# Patient Record
Sex: Female | Born: 1990 | Race: Black or African American | Hispanic: No | Marital: Single | Smoking: Never smoker
Health system: Southern US, Community
[De-identification: ages and names within clinical notes are randomized; demographics above are authoritative.]

## PROBLEM LIST (undated history)

## (undated) DIAGNOSIS — E669 Obesity, unspecified: Secondary | ICD-10-CM

## (undated) DIAGNOSIS — E119 Type 2 diabetes mellitus without complications: Secondary | ICD-10-CM

## (undated) DIAGNOSIS — I10 Essential (primary) hypertension: Secondary | ICD-10-CM

## (undated) HISTORY — DX: Obesity, unspecified: E66.9

---
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
## 2011-01-30 ENCOUNTER — Other Ambulatory Visit: Payer: Self-pay | Admitting: Family

## 2011-01-30 ENCOUNTER — Ambulatory Visit (INDEPENDENT_AMBULATORY_CARE_PROVIDER_SITE_OTHER): Payer: 59 | Admitting: Family

## 2011-01-30 ENCOUNTER — Encounter: Payer: Self-pay | Admitting: Family

## 2011-01-30 VITALS — BP 90/60 | HR 88 | Temp 97.2°F | Resp 16 | Ht 65.0 in | Wt 268.0 lb

## 2011-01-30 DIAGNOSIS — N898 Other specified noninflammatory disorders of vagina: Secondary | ICD-10-CM

## 2011-01-30 DIAGNOSIS — N760 Acute vaginitis: Secondary | ICD-10-CM

## 2011-01-30 DIAGNOSIS — A499 Bacterial infection, unspecified: Secondary | ICD-10-CM

## 2011-01-30 DIAGNOSIS — B9689 Other specified bacterial agents as the cause of diseases classified elsewhere: Secondary | ICD-10-CM

## 2011-01-30 MED ORDER — METRONIDAZOLE 500 MG PO TABS: 500.0000 mg | ORAL_TABLET | Freq: Two times a day (BID) | ORAL | Status: AC

## 2011-01-30 NOTE — Patient Instructions (Signed)
Bacterial Vaginosis (BV)  Bacterial vaginosis (BV) is a vaginal infection where the normal balance of bacteria in the vagina is disrupted. The normal balance is then replaced by an overgrowth of certain bacteria. There are several different kinds of bacteria that can cause BV. BV is the most common vaginal infection in women of childbearing age.  CAUSES   The cause of BV is not fully understood. BV develops when there is an increase or imbalance of harmful bacteria.    Some activities or behaviors can upset the normal balance of bacteria in the vagina and put women at increased risk including:    Having a new sex partner or multiple sex partners.    Douching.    Using an intrauterine device (IUD) for contraception.    It is not clear what role sexual activity plays in the development of BV. However, women that have never had sexual intercourse are rarely infected with BV.   Women do not get BV from toilet seats, bedding, swimming pools or from touching objects around them.   SYMPTOMS   Grey vaginal discharge.    A fish like odor with discharge, especially after sexual intercourse.    Itching or burning of the vagina and vulva.    Burning or pain with urination.    Some women have no signs or symptoms at all.   DIAGNOSIS   Your caregiver must examine the vagina for signs of BV. Your caregiver will perform lab tests and look at the sample of vaginal fluid through a microscope. They will look for bacteria and abnormal cells (clue cells), a pH test higher than 4.5, and a positive amine test all associated with BV.   RISKS AND COMPLICATIONS   Pelvic inflammatory disease (PID).    Infections following gynecology surgery.    Developing HIV.    Developing herpes virus.   TREATMENT  Sometimes BV will clear up without treatment. However, all women with symptoms of BV should be treated to avoid complications, especially if gynecology surgery is planned. Female partners generally do not need to be treated. However,  BV may spread between female sex partners so treatment is helpful in preventing a recurrence of BV.    BV may be treated with medicines that kill germs (antibiotics). The antibiotics come in either pill or vaginal cream forms. Either can be used with non-pregnant or pregnant women, but the recommended dosages differ. These antibiotics are not harmful to the baby.    BV can recur after treatment. If this happens, a second course of antibiotics will often be prescribed.    Treatment is important for pregnant women. If not treated, BV can cause a premature delivery, especially for a pregnant woman who had a premature birth in the past. All pregnant women who have symptoms of BV should be checked and treated.    For chronic reoccurrence of BV, treatment with a type of prescribed gel vaginally twice a week is helpful.   HOME CARE INSTRUCTIONS   Finish all medication as directed by your caregiver.    Do not have sex until treatment is completed.    Tell your sexual partner that you have a vaginal infection. They should see their caregiver and be treated if they have problems, such as a mild rash or itching.    Practice safe sex. Use condoms. Only have one sex partner.   PREVENTION  Basic prevention steps can help reduce the risk of upsetting the natural balance of bacteria in the vagina and   developing BV:   Do not have sexual intercourse (be abstinent).    Do not douche.    Use all of the medicine prescribed for treatment of BV, even if the signs and symptoms go away.    Tell your sex partner if you have BV. That way, they can be treated, if needed, to prevent reoccurrence.   SEEK MEDICAL CARE IF:   Your symptoms are not improving after three days of treatment.    You have increased discharge, pain or fever.   MAKE SURE YOU:    Understand these instructions.    Will watch your condition.    Will get help right away if you are not doing well or get worse.   FOR MORE INFORMATION  Division of STD Prevention  (DSTDP)  Centers for Disease Control and Prevention  www.cdc.gov/std  Order Publications Online at www.cdc.gov/std/pubs/  CDC National Prevention Information Network (NPIN)  E-mail: info@cdcnpin.org  www.cdcnpin.org    American Social Health Association (ASHA)  [address] [address]  [place], [place] [address]  www.ashastd.org   Document Released: 04/13/2005 Document Re-Released: 07/08/2009  Exit[employer] Patient Information 2011 [employer].

## 2011-01-30 NOTE — Progress Notes (Signed)
  Subjective:    Patient ID: [name], female    DOB: 24-Mar-1991, 20 y.o.   MRN: [id number]  HPI Pt is here with report of creamy white discharge x 3 weeks; +odor.  Denies pelvic pain or change in partner; with partner x 5 years.  No other questions or concerns.     Review of Systems  Genitourinary: Positive for vaginal discharge. Negative for vaginal bleeding and vaginal pain.  All other systems reviewed and are negative.       Objective:   Physical Exam  Constitutional: She is oriented to person, place, and time. She appears well-developed and well-nourished.  HENT:  Head: Normocephalic.  Neck: Normal range of motion. Neck supple.  Abdominal: Soft. There is no tenderness.  Genitourinary: Cervix exhibits no motion tenderness, no discharge and no friability. Vaginal discharge (whitish brown creamy discharge) found.  Neurological: She is alert and oriented to person, place, and time.  Skin: Skin is warm and dry.    Wet prep - + clue, no trich, no yeast      Assessment & Plan:  Bacterial Vaginosis  Plan:   Flagyl GC/CT to lab F/U as needed

## 2011-01-31 LAB — GC/CHLAMYDIA PROBE AMP, GENITAL: GC Probe Amp, Genital: NEGATIVE

## 2011-02-02 LAB — WET PREP, GENITAL: Yeast Wet Prep HPF POC: NONE SEEN

## 2011-02-04 ENCOUNTER — Ambulatory Visit: Payer: 59 | Admitting: Obstetrics & Gynecology

## 2011-02-12 ENCOUNTER — Inpatient Hospital Stay (INDEPENDENT_AMBULATORY_CARE_PROVIDER_SITE_OTHER)
Admission: RE | Admit: 2011-02-12 | Discharge: 2011-02-12 | Disposition: A | Discharge status: Home / Self Care | Payer: 59 | Source: Ambulatory Visit | Attending: Family Medicine | Admitting: Family Medicine

## 2011-02-12 ENCOUNTER — Encounter: Payer: Self-pay | Admitting: Family Medicine

## 2011-02-12 ENCOUNTER — Telehealth: Payer: Self-pay | Admitting: Family Medicine

## 2011-02-12 DIAGNOSIS — M940 Chondrocostal junction syndrome [Tietze]: Secondary | ICD-10-CM

## 2011-02-12 DIAGNOSIS — J069 Acute upper respiratory infection, unspecified: Secondary | ICD-10-CM

## 2011-02-12 DIAGNOSIS — L03319 Cellulitis of trunk, unspecified: Secondary | ICD-10-CM

## 2011-02-12 NOTE — Telephone Encounter (Signed)
Pt called and was seen in UC and was told by the provider that she needed to be seen since she has boil under her breast, and may need to be checked for diabetes. Plan:  Pt was scheduled an appt with Dr. Linford Arnold for 02-24-11. Jarvis Newcomer, LPN [place]

## 2011-02-24 ENCOUNTER — Ambulatory Visit: Payer: 59 | Admitting: Family Medicine

## 2011-02-24 DIAGNOSIS — Z0289 Encounter for other administrative examinations: Secondary | ICD-10-CM

## 2011-03-30 NOTE — Progress Notes (Signed)
Summary: cough,wheezing,sob,chest pain...WSE (rm 4)   Vital Signs:  Patient Profile:   20 Years Old Female CC:      wheeze, productive cough, SOB x this AM Height:     65 inches Weight:      266 pounds O2 Sat:      97 % O2 treatment:    Room Air Temp:     98.5 degrees F oral Pulse rate:   95 / minute Resp:     14 per minute BP sitting:   122 / 81  (left arm) Cuff size:   large  Vitals Entered By: Lajean Saver RN (February 12, 2011 2:19 PM)                  Updated Prior Medication List: MIRENA 20 MCG/24HR IUD (LEVONORGESTREL)   Current Allergies: ! * SEAFOODHistory of Present Illness Chief Complaint: wheeze, productive cough, SOB x this AM History of Present Illness:  Subjective: Patient presents with two problems: 1)  She awakened this morning with tightness in her anterior chest and wheezing.  She has developed a mild cough today. 2)  She complains of one year history of frequent "boils" that usually drain and heal spontaneously.  She presently has two lesions beneath her right breast.  She admits that she has a family history of diabetes No sore throat No pleuritic pain ? nasal congestion No post-nasal drainage No sinus pain/pressure No itchy/red eyes No earache No hemoptysis ? SOB No fever/chills No nausea No vomiting No abdominal pain No diarrhea + fatigue No myalgias No headache   REVIEW OF SYSTEMS Constitutional Symptoms      Denies fever, chills, night sweats, weight loss, weight gain, and fatigue.  Eyes       Denies change in vision, eye pain, eye discharge, glasses, contact lenses, and eye surgery. Ear/Nose/Throat/Mouth       Denies hearing loss/aids, change in hearing, ear pain, ear discharge, dizziness, frequent runny nose, frequent nose bleeds, sinus problems, sore throat, hoarseness, and tooth pain or bleeding.  Respiratory       Complains of productive cough, wheezing, and shortness of breath.      Denies dry cough, asthma, bronchitis,  and emphysema/COPD.  Cardiovascular       Denies murmurs, chest pain, and tires easily with exhertion.    Gastrointestinal       Denies stomach pain, nausea/vomiting, diarrhea, constipation, blood in bowel movements, and indigestion. Genitourniary       Denies painful urination, kidney stones, and loss of urinary control. Neurological       Denies paralysis, seizures, and fainting/blackouts. Musculoskeletal       Denies muscle pain, joint pain, joint stiffness, decreased range of motion, redness, swelling, muscle weakness, and gout.  Skin       Denies bruising, unusual mles/lumps or sores, and hair/skin or nail changes.  Psych       Denies mood changes, temper/anger issues, anxiety/stress, speech problems, depression, and sleep problems. Other Comments: Patient awoke this AM with a slightly productive cough (clear mucous) and wheezing. No previous respiratory illness   Past History:  Past Medical History: Unremarkable  Past Surgical History: Denies surgical history  Social History: Never Smoked Alcohol use-no Drug use-no Smoking Status:  never Drug Use:  no   Objective:  Markedly obese; no acute distress  Eyes:  Pupils are equal, round, and reactive to light and accomodation.  Extraocular movement is intact.  Conjunctivae are not inflamed.  Ears:  Canals normal.  Tympanic membranes normal.   Nose:  Mildly congested turbinates.  No sinus tenderness  Pharynx:  Normal  Neck:  Supple. Tender shotty posterior nodes are palpated bilaterally.  Lungs:  Clear to auscultation.  Breath sounds are equal.  Chest:  Distinct tenderness to palpation over the mid-sternum.  Palpation there recreates her pain Heart:  Regular rate and rhythm without murmurs, rubs, or gallops.  Abdomen:  Nontender without masses or hepatosplenomegaly.  Bowel sounds are present.  No CVA or flank tenderness.  Skin:  Beneath the right breast in intertriginous area are two superficial excoriations each 1cm in  dia.  Small amount of purulent discharge present.  No fluctuance Assessment New Problems: UPPER RESPIRATORY INFECTION, ACUTE (ICD-465.9) COSTOCHONDRITIS, ACUTE (ICD-733.6) CELLULITIS, TRUNK (ICD-682.2)  SUSPECT EARLY VIRAL URI WITH COSTOCHONDRITIS AND POSTERIOR CERVICAL ADENOPATHY RECURRING CELLULITIS AND ABSCESS:  SUSPECT TYPE 2 DIABETES  Plan New Medications/Changes: PREDNISONE 10 MG TABS (PREDNISONE) 2 PO today, then 2 BID for 2 days, then 1 two times a day for 2 days, then 1 daily for 2 days.  Take PC  #16 x 0, 02/12/2011, Donna Christen MD DOXYCYCLINE HYCLATE 100 MG CAPS (DOXYCYCLINE HYCLATE) One by mouth two times a day  #20 x 0, 02/12/2011, Donna Christen MD  New Orders: T-Culture, Wound [[id number]] Pulse Oximetry (single measurment) [40981] New Patient Level IV [19147] Planning Comments:   For costochondritis, will begin tapering course of prednisone.  Apply heating pad to sternum several times daily.  Given a Water quality scientist patient information and instruction sheet on topic costochondritis Begin doxycycline.  Wound culture taken.  Advised to keep lesions beneath right breast bandaged daily until healed. If URI symptoms develop, treat symptomatically:  Increase fluid intake, begin expectorant/decongestant, topical decongestant,  cough suppressant at bedtime.  Followup with PCP if not improving 7 to 10 days.  Follow-up with PCP for diabetes evaluation   The patient and/or caregiver has been counseled thoroughly with regard to medications prescribed including dosage, schedule, interactions, rationale for use, and possible side effects and they verbalize understanding.  Diagnoses and expected course of recovery discussed and will return if not improved as expected or if the condition worsens. Patient and/or caregiver verbalized understanding.  Prescriptions: PREDNISONE 10 MG TABS (PREDNISONE) 2 PO today, then 2 BID for 2 days, then 1 two times a day for 2 days, then 1 daily for 2 days.   Take PC  #16 x 0   Entered and Authorized by:   Donna Christen MD   Signed by:   Donna Christen MD on 02/12/2011   Method used:   Print then Give to Patient   RxID:   [id number] DOXYCYCLINE HYCLATE 100 MG CAPS (DOXYCYCLINE HYCLATE) One by mouth two times a day  #20 x 0   Entered and Authorized by:   Donna Christen MD   Signed by:   Donna Christen MD on 02/12/2011   Method used:   Print then Give to Patient   RxID:   [id number]   Patient Instructions: 1)  If sinus congestion and cough develop take Mucinex D (guaifenesin with decongestant) twice daily   2)  Increase fluid intake, rest. 3)  May use Afrin nasal spray (or generic oxymetazoline) twice daily for about 5 days.  Also recommend using saline nasal spray several times daily and/or saline nasal irrigation. 4)  If cough develops at night, begin benzonatate at bedtime. 5)  Follow-up with family doctor to check for diabetes. 6)  Followup with family doctor if not improving 7 to  10 days.   Orders Added: 1)  T-Culture, Wound [[id number]] 2)  Pulse Oximetry (single measurment) [94760] 3)  New Patient Level IV [16109]

## 2011-03-30 NOTE — Letter (Signed)
Summary: Out of Work  [facility]  [address]   [place], [place] [address]   Phone: [phone]  Fax: [phone]    February 12, 2011   Employee:  HELINA HULLUM    To Whom It May Concern:   For Medical reasons, please excuse the above named employee from work today.   If you need additional information, please feel free to contact our office.         Sincerely,    Donna Christen MD

## 2011-05-21 ENCOUNTER — Telehealth: Payer: Self-pay | Admitting: *Deleted

## 2011-05-21 ENCOUNTER — Ambulatory Visit: Payer: 59 | Admitting: Family Medicine

## 2011-05-21 MED ORDER — PROMETHAZINE HCL 25 MG RE SUPP: 25.0000 mg | Freq: Four times a day (QID) | RECTAL | Status: DC | PRN

## 2011-05-21 NOTE — Telephone Encounter (Signed)
OK for phenergan 25mg  wither tabs or supp, every 6 hours prn. #6.

## 2011-05-21 NOTE — Telephone Encounter (Signed)
Pt woke up this AM w/ vomiting and diarrhea. I advised Pt to stay hydrated and call if not better or worse tmw afternoon. Can we send nausea med for Pt?

## 2011-09-08 ENCOUNTER — Encounter: Payer: Self-pay | Admitting: Family Medicine

## 2011-09-08 ENCOUNTER — Ambulatory Visit (INDEPENDENT_AMBULATORY_CARE_PROVIDER_SITE_OTHER): Payer: 59 | Admitting: Family Medicine

## 2011-09-08 VITALS — BP 127/73 | HR 107 | Temp 97.8°F | Ht 65.0 in | Wt 280.0 lb

## 2011-09-08 DIAGNOSIS — J329 Chronic sinusitis, unspecified: Secondary | ICD-10-CM

## 2011-09-08 DIAGNOSIS — H6690 Otitis media, unspecified, unspecified ear: Secondary | ICD-10-CM

## 2011-09-08 DIAGNOSIS — J029 Acute pharyngitis, unspecified: Secondary | ICD-10-CM

## 2011-09-08 DIAGNOSIS — J00 Acute nasopharyngitis [common cold]: Secondary | ICD-10-CM

## 2011-09-08 LAB — POCT RAPID STREP A (OFFICE): Rapid Strep A Screen: NEGATIVE

## 2011-09-08 MED ORDER — CEFUROXIME AXETIL 500 MG PO TABS: 500.0000 mg | ORAL_TABLET | Freq: Two times a day (BID) | ORAL | Status: DC

## 2011-09-08 MED ORDER — FEXOFENADINE-PSEUDOEPHED ER 180-240 MG PO TB24: 1.0000 | ORAL_TABLET | Freq: Every day | ORAL | Status: AC

## 2011-09-08 NOTE — Patient Instructions (Signed)
Pharyngitis, Viral and Bacterial  Pharyngitis is soreness (inflammation) or infection of the pharynx. It is also called a sore throat.  CAUSES   Most sore throats are caused by viruses and are part of a cold. However, some sore throats are caused by strep and other bacteria. Sore throats can also be caused by post nasal drip from draining sinuses, allergies and sometimes from sleeping with an open mouth. Infectious sore throats can be spread from person to person by coughing, sneezing and sharing cups or eating utensils.  TREATMENT   Sore throats that are viral usually last 3-4 days. Viral illness will get better without medications (antibiotics). Strep throat and other bacterial infections will usually begin to get better about 24-48 hours after you begin to take antibiotics.  HOME CARE INSTRUCTIONS    If the caregiver feels there is a bacterial infection or if there is a positive strep test, they will prescribe an antibiotic. The full course of antibiotics must be taken. If the full course of antibiotic is not taken, you or your child may become ill again. If you or your child has strep throat and do not finish all of the medication, serious heart or kidney diseases may develop.   Drink enough water and fluids to keep your urine clear or pale yellow.   Only take over-the-counter or prescription medicines for pain, discomfort or fever as directed by your caregiver.   Get lots of rest.   Gargle with salt water ( tsp. of salt in a glass of water) as often as every 1-2 hours as you need for comfort.   Hard candies may soothe the throat if individual is not at risk for choking. Throat sprays or lozenges may also be used.  SEEK MEDICAL CARE IF:    Large, tender lumps in the neck develop.   A rash develops.   Green, yellow-brown or bloody sputum is coughed up.   Your baby is older than 3 months with a rectal temperature of 100.5 F (38.1 C) or higher for more than 1 day.  SEEK IMMEDIATE MEDICAL CARE IF:    A  stiff neck develops.   You or your child are drooling or unable to swallow liquids.   You or your child are vomiting, unable to keep medications or liquids down.   You or your child has severe pain, unrelieved with recommended medications.   You or your child are having difficulty breathing (not due to stuffy nose).   You or your child are unable to fully open your mouth.   You or your child develop redness, swelling, or severe pain anywhere on the neck.   You have a fever.   Your baby is older than 3 months with a rectal temperature of 102 F (38.9 C) or higher.   Your baby is 3 months old or younger with a rectal temperature of 100.4 F (38 C) or higher.  MAKE SURE YOU:    Understand these instructions.   Will watch your condition.   Will get help right away if you are not doing well or get worse.  Document Released: 04/13/2005 Document Revised: 04/02/2011 Document Reviewed: 07/11/2007  ExitCare Patient Information 2012 [employer].    Sinusitis  Sinuses are air pockets within the bones of your face. The growth of bacteria within a sinus leads to infection. The infection prevents the sinuses from draining. This infection is called sinusitis.  SYMPTOMS   There will be different areas of pain depending on which sinuses   have become infected.   The maxillary sinuses often produce pain beneath the eyes.   Frontal sinusitis may cause pain in the middle of the forehead and above the eyes.  Other problems (symptoms) include:   Toothaches.   Colored, pus-like (purulent) drainage from the nose.   Swelling, warmth, and tenderness over the sinus areas may be signs of infection.  TREATMENT   Sinusitis is most often determined by an exam.X-rays may be taken. If x-rays have been taken, make sure you obtain your results or find out how you are to obtain them. Your caregiver may give you medications (antibiotics). These are medications that will help kill the bacteria causing the infection. You may also be  given a medication (decongestant) that helps to reduce sinus swelling.   HOME CARE INSTRUCTIONS    Only take over-the-counter or prescription medicines for pain, discomfort, or fever as directed by your caregiver.   Drink extra fluids. Fluids help thin the mucus so your sinuses can drain more easily.   Applying either moist heat or ice packs to the sinus areas may help relieve discomfort.   Use saline nasal sprays to help moisten your sinuses. The sprays can be found at your local drugstore.  SEEK IMMEDIATE MEDICAL CARE IF:   You have a fever.   You have increasing pain, severe headaches, or toothache.   You have nausea, vomiting, or drowsiness.   You develop unusual swelling around the face or trouble seeing.  MAKE SURE YOU:    Understand these instructions.   Will watch your condition.   Will get help right away if you are not doing well or get worse.  Document Released: 04/13/2005 Document Revised: 04/02/2011 Document Reviewed: 11/10/2006  ExitCare Patient Information 2012 [employer].

## 2011-09-08 NOTE — Progress Notes (Signed)
  Subjective:    Patient ID: [name], female    DOB: Feb 10, 1991, 21 y.o.   MRN: [id number]  Sore Throat  This is a new problem. The current episode started yesterday. The problem has been unchanged. Neither side of throat is experiencing more pain than the other. There has been no fever. The pain is moderate. Associated symptoms include congestion, coughing, a hoarse voice, shortness of breath, swollen glands and trouble swallowing. Pertinent negatives include no abdominal pain, diarrhea, drooling, ear discharge, ear pain, headaches, plugged ear sensation, neck pain, stridor or vomiting. She has had no exposure to strep or mono. She has tried nothing for the symptoms.      Review of Systems  HENT: Positive for congestion, hoarse voice and trouble swallowing. Negative for ear pain, drooling, neck pain and ear discharge.   Respiratory: Positive for cough and shortness of breath. Negative for stridor.   Gastrointestinal: Negative for vomiting, abdominal pain and diarrhea.  Neurological: Negative for headaches.  All other systems reviewed and are negative.      BP 127/73  Pulse 107  Temp(Src) 97.8 F (36.6 C) (Oral)  Ht 5\' 5"  (1.651 m)  Wt 280 lb (127.007 kg)  BMI 46.59 kg/m2  SpO2 98% Objective:   Physical Exam  Vitals reviewed. Constitutional: She is oriented to person, place, and time. She appears well-developed and well-nourished.       Obese BF  HENT:  Head: Normocephalic and atraumatic.  Right Ear: Hearing normal. Tympanic membrane is erythematous.  Left Ear: Hearing and tympanic membrane normal.  Nose: Mucosal edema and rhinorrhea present.  Mouth/Throat: Mucous membranes are pale. No oral lesions. No dental caries. Posterior oropharyngeal edema and posterior oropharyngeal erythema present.  Neck: Neck supple.  Cardiovascular: Normal rate and regular rhythm.   Pulmonary/Chest: Effort normal and breath sounds normal.  Lymphadenopathy:    She has cervical adenopathy.    Neurological: She is alert and oriented to person, place, and time.  Skin: Skin is warm and dry. No erythema.  Psychiatric: She has a normal mood and affect.      Results for orders placed in visit on 09/08/11  POCT RAPID STREP A (OFFICE)      Component Value Range   Rapid Strep A Screen Negative  Negative    Assessment & Plan:  #1 nasopharyngitis, pharyngitis, and will obtain a strep culture otitis media, early sinusitis Since patient has had multiple strep infections and right ear is red we'll place her on Augmentin 875 one tablet twice a day Allegra-D once a day and a note from work for today , Wednesday and Thursday and I will allow her to return to work on Friday. Notify Thursday if not better. Turns out the patient has had difficulty with gag reflex plus I will cancel the Augmentin in place on Ceftin instead.Marland Kitchen

## 2011-09-16 ENCOUNTER — Ambulatory Visit (INDEPENDENT_AMBULATORY_CARE_PROVIDER_SITE_OTHER): Payer: 59 | Admitting: Obstetrics & Gynecology

## 2011-09-16 ENCOUNTER — Encounter: Payer: Self-pay | Admitting: Obstetrics & Gynecology

## 2011-09-16 VITALS — BP 108/80 | HR 76 | Temp 97.2°F | Resp 16 | Ht 65.0 in | Wt 259.0 lb

## 2011-09-16 DIAGNOSIS — N949 Unspecified condition associated with female genital organs and menstrual cycle: Secondary | ICD-10-CM

## 2011-09-16 DIAGNOSIS — R102 Pelvic and perineal pain: Secondary | ICD-10-CM

## 2011-09-16 NOTE — Progress Notes (Signed)
  Subjective:    Patient ID: [name], female    DOB: 16-Aug-1990, 21 y.o.   MRN: [id number]  HPI  [name] is here today for a work in visit because she experienced significant pelvic pain yesterday with right worse than left. She took no meds and it had improved by last night. It has resolved today. She has no other complaints.   Review of Systems Her pap will be due when she turns 22 in February 2014    Objective:   Physical Exam   Benign bimanual exam. No tenderness or masses. Uterus NSSA, mobile     Assessment & Plan:  Resolution of an episode of pelvic pain. Pain precautions given.  RTC 3/14 for annual

## 2011-12-22 ENCOUNTER — Ambulatory Visit (INDEPENDENT_AMBULATORY_CARE_PROVIDER_SITE_OTHER): Payer: 59 | Admitting: Family Medicine

## 2011-12-22 ENCOUNTER — Encounter: Payer: Self-pay | Admitting: Family Medicine

## 2011-12-22 VITALS — BP 132/82 | HR 98 | Wt 288.0 lb

## 2011-12-22 DIAGNOSIS — L309 Dermatitis, unspecified: Secondary | ICD-10-CM

## 2011-12-22 DIAGNOSIS — L259 Unspecified contact dermatitis, unspecified cause: Secondary | ICD-10-CM

## 2011-12-22 DIAGNOSIS — L301 Dyshidrosis [pompholyx]: Secondary | ICD-10-CM

## 2011-12-22 MED ORDER — BETAMETHASONE VALERATE 0.1 % EX OINT: TOPICAL_OINTMENT | Freq: Every day | CUTANEOUS | Status: DC

## 2011-12-22 NOTE — Progress Notes (Signed)
  Subjective:    Patient ID: [name], female    DOB: 22-Dec-1990, 21 y.o.   MRN: [id number]  HPI Hx of eczmea .  Rash started started about 2 weeks ago on her palms and wrist. HAs an eczema on the back so fher hands and says that looks normal for her. Uses OTC triamcinilone oint.  No pain but very itching.     Review of Systems     Objective:   Physical Exam  Constitutional: She appears well-developed and well-nourished.  Skin:       He has an papular dry peeling rash that appears dyshydrotic on her palms and wrist. She has some similar darker hyperpigmented lesions on the backs of her hands.    Psychiatric: She has a normal mood and affect. Her behavior is normal.          Assessment & Plan:  Dyshydrotic eczema. Will tx with topical steroid. Call if not better in 2 weeks. Keep hands more mositurized. See if she is coming into contact with chemicals ro cleaners that she doesn't usually get exposed to.

## 2012-01-04 ENCOUNTER — Other Ambulatory Visit (HOSPITAL_COMMUNITY)
Admission: RE | Admit: 2012-01-04 | Discharge: 2012-01-04 | Disposition: A | Discharge status: Home / Self Care | Payer: 59 | Source: Ambulatory Visit | Attending: Family Medicine | Admitting: Family Medicine

## 2012-01-04 ENCOUNTER — Ambulatory Visit (INDEPENDENT_AMBULATORY_CARE_PROVIDER_SITE_OTHER): Payer: 59 | Admitting: Family Medicine

## 2012-01-04 ENCOUNTER — Encounter: Payer: Self-pay | Admitting: Family Medicine

## 2012-01-04 VITALS — BP 130/80 | HR 80 | Wt 288.0 lb

## 2012-01-04 DIAGNOSIS — Z124 Encounter for screening for malignant neoplasm of cervix: Secondary | ICD-10-CM

## 2012-01-04 DIAGNOSIS — Z30432 Encounter for removal of intrauterine contraceptive device: Secondary | ICD-10-CM

## 2012-01-04 DIAGNOSIS — M549 Dorsalgia, unspecified: Secondary | ICD-10-CM

## 2012-01-04 DIAGNOSIS — Z01419 Encounter for gynecological examination (general) (routine) without abnormal findings: Secondary | ICD-10-CM

## 2012-01-04 DIAGNOSIS — N62 Hypertrophy of breast: Secondary | ICD-10-CM

## 2012-01-04 NOTE — Progress Notes (Signed)
  Subjective:    Patient ID: [name], female    DOB: [birth year], 21 y.o.   MRN: [id number]  HPI Large breasts.  Says starting her senior year of high school she started having pain and discomfort. Says wearing a bra is extremely uncomfortable.  She has to sleep on her back bc they are so uncomfortable. Then when she sleeps on her back she gets a lot of discomfort in her upper back. REc Dr. Meredith Mody here in [place].  Get frequent pain in her sides and down her mid back from the heavy breasts.    She wants her IUD. She want to have some periods Not sexually active at this oint.  It was originally began after she had a miscarriage. She thinks IUD couldd be contributing to her weight gain. It is hard to exercise.   Review of Systems     Objective:   Physical Exam  Constitutional: She is oriented to person, place, and time. She appears well-developed and well-nourished.       She is midly obese.   HENT:  Head: Normocephalic and atraumatic.       She does have pendulous breast tissue.  Cardiovascular: Normal rate, regular rhythm and normal heart sounds.   Pulmonary/Chest: Effort normal and breath sounds normal.  Musculoskeletal:       Upper and lower back with normal range of motion. She is tender over the thoracic and lumbar paraspinous muscles.  Neurological: She is alert and oriented to person, place, and time.  Skin: Skin is warm and dry.  Psychiatric: She has a normal mood and affect. Her behavior is normal.    IUD removal procedure Cervix was easily identified. Normal appearance, normal discharge. No abnormalities of the vaginal walls. Pap smear was collected. Long hemostats were used to clamp the strain in the IUD was gently extracted. Patient tolerated the procedure well.      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 They are also painful and tender. I recommend referral to a plastic surgeon for further evaluation and  consultation for possible breast reduction surgery. She would like to check with her insurance first. In the meantime work on gentle stretches and upper back and take Aleve or ibuprofen as needed. Make sure wearing good supportive fitting bra to help relieve discomfort in the actual breast tissue.Work on exercise and weight loss.   Back Pain - work on gentle stretches and upper back and take Aleve or ibuprofen as needed. Make sure wearing good supportive fitting bra to help relieve discomfort in the actual breast tissue.Work on exercise and weight loss.    IUD removal-patient tolerated procedure well. Make sure using condoms if has intercourse.

## 2012-01-07 ENCOUNTER — Other Ambulatory Visit: Payer: Self-pay | Admitting: Family Medicine

## 2012-01-07 MED ORDER — FLUCONAZOLE 150 MG PO TABS: 150.0000 mg | ORAL_TABLET | Freq: Once | ORAL | Status: AC

## 2012-02-22 ENCOUNTER — Encounter: Payer: Self-pay | Admitting: Sports Medicine

## 2012-02-22 ENCOUNTER — Ambulatory Visit (INDEPENDENT_AMBULATORY_CARE_PROVIDER_SITE_OTHER): Payer: 59 | Admitting: Sports Medicine

## 2012-02-22 VITALS — BP 126/87 | HR 88 | Wt 297.0 lb

## 2012-02-22 DIAGNOSIS — H00019 Hordeolum externum unspecified eye, unspecified eyelid: Secondary | ICD-10-CM

## 2012-02-22 MED ORDER — DOXYCYCLINE HYCLATE 100 MG PO TABS: 100.0000 mg | ORAL_TABLET | Freq: Two times a day (BID) | ORAL | Status: AC

## 2012-02-22 MED ORDER — HYDROCODONE-ACETAMINOPHEN 5-325 MG PO TABS: 1.0000 | ORAL_TABLET | Freq: Four times a day (QID) | ORAL | Status: DC | PRN

## 2012-02-22 NOTE — Progress Notes (Signed)
Subjective:    CC: Left eye pain  HPI: [name] notes pain over her left upper eyelid, medial aspect. She's noted a small lump, that is draining a whitish substance. This been present for several days, and is very painful. She denies any changes in her vision, sick contacts, trauma. The pain is localized, and does not radiate.  Past medical history, Surgical history, Family history, Social history, Allergies, and medications have been entered into the medical record, reviewed, and no changes needed.   Review of Systems: No fevers, chills, night sweats, weight loss, chest pain, or shortness of breath.   Objective:    General: Well Developed, well nourished, and in no acute distress.  Neuro: Alert and oriented x3, extra-ocular muscles intact.  HEENT: Normocephalic, atraumatic, pupils equal round reactive to light, neck supple, no masses, no lymphadenopathy, thyroid nonpalpable. There is swelling over her superior eyelid of the left eye there is a discrete nodule, but opens to the inner eyelid and is draining pus. Vision is grossly normal. Skin: Warm and dry, no rashes. Cardiac: Regular rate and rhythm, no murmurs rubs or gallops.  Respiratory: Clear to auscultation bilaterally. Not using accessory muscles, speaking in full sentences.   Impression and Recommendations:

## 2012-02-22 NOTE — Patient Instructions (Signed)
Sty  A sty (hordeolum) is an infection of a gland in the eyelid located at the base of the eyelash. A sty may develop a white or yellow head of pus. It can be puffy (swollen). Usually, the sty will burst and pus will come out on its own. They do not leave lumps in the eyelid once they drain.  A sty is often confused with another form of cyst of the eyelid called a chalazion. Chalazions occur within the eyelid and not on the edge where the bases of the eyelashes are. They often are red, sore and then form firm lumps in the eyelid.  CAUSES    Germs (bacteria).   Lasting (chronic) eyelid inflammation.  SYMPTOMS    Tenderness, redness and swelling along the edge of the eyelid at the base of the eyelashes.   Sometimes, there is a white or yellow head of pus. It may or may not drain.  DIAGNOSIS   An ophthalmologist will be able to distinguish between a sty and a chalazion and treat the condition appropriately.   TREATMENT    Styes are typically treated with warm packs (compresses) until drainage occurs.   In rare cases, medicines that kill germs (antibiotics) may be prescribed. These antibiotics may be in the form of drops, cream or pills.   If a hard lump has formed, it is generally necessary to do a small incision and remove the hardened contents of the cyst in a minor surgical procedure done in the office.   In suspicious cases, your caregiver may send the contents of the cyst to the lab to be certain that it is not a rare, but dangerous form of cancer of the glands of the eyelid.  HOME CARE INSTRUCTIONS    Wash your hands often and dry them with a clean towel. Avoid touching your eyelid. This may spread the infection to other parts of the eye.   Apply heat to your eyelid for 10 to 20 minutes, several times a day, to ease pain and help to heal it faster.   Do not squeeze the sty. Allow it to drain on its own. Wash your eyelid carefully 3 to 4 times per day to remove any pus.  SEEK IMMEDIATE MEDICAL CARE IF:     Your eye becomes painful or puffy (swollen).   Your vision changes.   Your sty does not drain by itself within 3 days.   Your sty comes back within a short period of time, even with treatment.   You have redness (inflammation) around the eye.   You have a fever.  Document Released: 01/21/2005 Document Revised: 07/06/2011 Document Reviewed: 09/25/2008  [employer] Patient Information 2013 [employer].

## 2012-02-22 NOTE — Assessment & Plan Note (Signed)
Home topical heat massage, and compress. Doxycycline. Hydrocodone for pain. Return to clinic in one week, if no better I will drain it surgically.

## 2012-03-28 ENCOUNTER — Telehealth: Payer: Self-pay | Admitting: *Deleted

## 2012-03-28 NOTE — Telephone Encounter (Signed)
Yes, this can be normal. Sometimes it takes 6 months to regulate periods.

## 2012-03-28 NOTE — Telephone Encounter (Signed)
Pt.notified

## 2012-03-28 NOTE — Telephone Encounter (Signed)
Had Mirena taken out 9th of Sept. Started first period on Sept 12th. Then another one Oct. 19th stopped bleeding Oct. 23rd. Yesterday spotting with cramps and brownish tint to it. Did not have a period in November- wants to know if this normal

## 2012-12-05 ENCOUNTER — Encounter: Payer: Self-pay | Admitting: Physician Assistant

## 2012-12-05 ENCOUNTER — Other Ambulatory Visit: Payer: Self-pay | Admitting: Physician Assistant

## 2012-12-05 ENCOUNTER — Ambulatory Visit (INDEPENDENT_AMBULATORY_CARE_PROVIDER_SITE_OTHER): Payer: 59 | Admitting: Physician Assistant

## 2012-12-05 VITALS — BP 125/75 | HR 103 | Wt 292.0 lb

## 2012-12-05 DIAGNOSIS — L293 Anogenital pruritus, unspecified: Secondary | ICD-10-CM

## 2012-12-05 DIAGNOSIS — N898 Other specified noninflammatory disorders of vagina: Secondary | ICD-10-CM

## 2012-12-05 LAB — WET PREP FOR TRICH, YEAST, CLUE
Trich, Wet Prep: NONE SEEN
Yeast Wet Prep HPF POC: NONE SEEN

## 2012-12-05 MED ORDER — METRONIDAZOLE 500 MG PO TABS: 500.0000 mg | ORAL_TABLET | Freq: Two times a day (BID) | ORAL | Status: DC

## 2012-12-05 NOTE — Patient Instructions (Signed)
Will call with results of wet prep.

## 2012-12-05 NOTE — Progress Notes (Signed)
  Subjective:    Patient ID: [name], female    DOB: April 29, 1990, 22 y.o.   MRN: [id number]  HPI Patient presents to the clinic with vaginal itching and discharge for 3 days. It started after she got home from vacation. She has not been sexually active in over a year. Discharge is clear to white. There is a odor. Not tried anything to make better. No history of yeast/vaginal infections. Denies any abdominal pain, dysuria, back pain, bleeding, fever.     Review of Systems     Objective:   Physical Exam  Constitutional: She is oriented to person, place, and time. She appears well-developed and well-nourished.  Obese.  HENT:  Head: Normocephalic and atraumatic.  Cardiovascular: Regular rhythm and normal heart sounds.   Tachycardia a 103.   Pulmonary/Chest: Effort normal and breath sounds normal.  Genitourinary:  No erythema or swelling. Discharge was clear to yellow with strong odor.   Neurological: She is alert and oriented to person, place, and time.  Skin: Skin is warm and dry.  Psychiatric: She has a normal mood and affect. Her behavior is normal.          Assessment & Plan:  Vaginal itching/discharge- stat wet prep sent. Will call pt with results. Likely yeast or BV. Discussed with patient they are both different treatments.

## 2013-01-19 ENCOUNTER — Ambulatory Visit (INDEPENDENT_AMBULATORY_CARE_PROVIDER_SITE_OTHER): Payer: BC Managed Care – PPO | Admitting: Family Medicine

## 2013-01-19 ENCOUNTER — Encounter: Payer: Self-pay | Admitting: Family Medicine

## 2013-01-19 VITALS — BP 116/72 | HR 109 | Temp 97.8°F | Wt 289.0 lb

## 2013-01-19 DIAGNOSIS — J019 Acute sinusitis, unspecified: Secondary | ICD-10-CM

## 2013-01-19 MED ORDER — AZITHROMYCIN 250 MG PO TABS: ORAL_TABLET | ORAL | Status: DC

## 2013-01-19 NOTE — Progress Notes (Signed)
  Subjective:    Patient ID: [name], female    DOB: 1990/09/06, 22 y.o.   MRN: [id number]  HPI ST x 2 weeks. Has been trying OTC meds.  + HA.  + nasal congestion. Some diarrhea.  Cough is productive, yellow sputum. Really thick sputum.  Using mucinex.  No fever or chills or sweats. No facial pain. She works for [employer]. No ear pain.    Review of Systems     Objective:   Physical Exam  Constitutional: She is oriented to person, place, and time. She appears well-developed and well-nourished.  HENT:  Head: Normocephalic and atraumatic.  Right Ear: External ear normal.  Left Ear: External ear normal.  Nose: Nose normal.  Mouth/Throat: Oropharynx is clear and moist.  Right TM blocked by cerume. Left TM is clear.   Eyes: Conjunctivae and EOM are normal. Pupils are equal, round, and reactive to light.  Neck: Neck supple. No thyromegaly present.  Cardiovascular: Normal rate, regular rhythm and normal heart sounds.   Pulmonary/Chest: Effort normal and breath sounds normal. She has no wheezes.  Lymphadenopathy:    She has no cervical adenopathy.  Neurological: She is alert and oriented to person, place, and time.  Skin: Skin is warm and dry.  Psychiatric: She has a normal mood and affect.    Tender over the frontal sinuses and      Assessment & Plan:  Acute sinusitis - will tx with zpack per pt preference. Call if nto better in one week. Symptomatic care.

## 2013-01-19 NOTE — Patient Instructions (Signed)

## 2013-01-20 ENCOUNTER — Ambulatory Visit: Payer: BC Managed Care – PPO | Admitting: Family Medicine

## 2013-02-13 ENCOUNTER — Encounter: Payer: Self-pay | Admitting: Emergency Medicine

## 2013-02-13 ENCOUNTER — Emergency Department
Admission: EM | Admit: 2013-02-13 | Discharge: 2013-02-13 | Disposition: A | Discharge status: Home / Self Care | Payer: BC Managed Care – PPO | Source: Home / Self Care | Attending: Family Medicine | Admitting: Family Medicine

## 2013-02-13 DIAGNOSIS — R197 Diarrhea, unspecified: Secondary | ICD-10-CM

## 2013-02-13 DIAGNOSIS — R112 Nausea with vomiting, unspecified: Secondary | ICD-10-CM

## 2013-02-13 LAB — POCT CBC W AUTO DIFF (K'VILLE URGENT CARE)

## 2013-02-13 MED ORDER — PROMETHAZINE HCL 25 MG PO TABS: 25.0000 mg | ORAL_TABLET | Freq: Four times a day (QID) | ORAL | Status: DC | PRN

## 2013-02-13 NOTE — ED Notes (Signed)
[name] reports starting her menstrual cycle last night. This AM she c/o loose stool, vomiting once, dizziness and worse than typical cramping associated with menstrual cycle.

## 2013-02-13 NOTE — ED Provider Notes (Signed)
CSN: [id number]     Arrival date & time 02/13/13  1158 History   First MD Initiated Contact with Patient 02/13/13 1207     Chief Complaint  Patient presents with  . Dizziness  . Abdominal Cramping      HPI Comments: Patient reports that she began normal menses yesterday.  This morning she developed watery diarrhea, nausea with one episode of vomiting, dizziness, and more cramps than she normally has with menses.  She noticed a small amount of blood streaking in her stool but not consistent.  She felt hot today, and fatigued.  Denies recent foreign travel, or drinking untreated water in a wilderness environment.   Patient is a 22 y.o. female presenting with diarrhea. The history is provided by the patient.  Diarrhea Quality:  Watery Severity:  Moderate Onset quality:  Sudden Number of episodes:  Several Duration:  5 hours Timing:  Intermittent Progression:  Unchanged Relieved by:  Nothing Worsened by:  Nothing tried Ineffective treatments:  None tried Associated symptoms: abdominal pain and vomiting   Associated symptoms: no arthralgias, no chills, no recent cough, no diaphoresis, no fever, no headaches, no myalgias and no URI   Vomiting:    Quality:  Stomach contents   Number of occurrences:  One   Severity:  Mild   Duration:  1 hour   Timing:  Rare   Progression:  Improving Risk factors: no recent antibiotic use, no sick contacts, no suspicious food intake and no travel to endemic areas     Past Medical History  Diagnosis Date  . Obesity    History reviewed. No pertinent past surgical history. Family History  Problem Relation Age of Onset  . Alcohol abuse Father   . Breast cancer      aunt  . Heart attack      grandfather  . Depression      aunt  . Diabetes      grandmother   History  Substance Use Topics  . Smoking status: Never Smoker   . Smokeless tobacco: Never Used  . Alcohol Use: No   OB History   Grav Para Term Preterm Abortions TAB SAB Ect Mult  Living   1 0   1  1        Review of Systems  Constitutional: Negative for fever, chills and diaphoresis.  Gastrointestinal: Positive for vomiting, abdominal pain and diarrhea.  Musculoskeletal: Negative for arthralgias and myalgias.  Neurological: Negative for headaches.    Allergies  Review of patient's allergies indicates no known allergies.  Home Medications   Current Outpatient Rx  Name  Route  Sig  Dispense  Refill  . diclofenac sodium (VOLTAREN) 1 % GEL   Topical   Apply topically.         . promethazine (PHENERGAN) 25 MG tablet   Oral   Take 1 tablet (25 mg total) by mouth every 6 (six) hours as needed for nausea.   12 tablet   0    BP 122/84  Pulse 95  Temp(Src) 97.9 F (36.6 C) (Oral)  Resp 18  Ht 5\' 6"  (1.676 m)  Wt 288 lb (130.636 kg)  BMI 46.51 kg/m2  SpO2 97%  LMP 02/12/2013 Physical Exam Nursing notes and Vital Signs reviewed. Appearance:  Patient appears stated age, and in no acute distress.  Patient is obese (BMI 46.5) Eyes:  Pupils are equal, round, and reactive to light and accomodation.  Extraocular movement is intact.  Conjunctivae are not inflamed  Ears:  Canals normal.  Tympanic membranes normal.  Nose:  Normal turbinates.    Pharynx:  Normal; moist mucous membranes  Neck:  Supple without adenopathy Lungs:  Clear to auscultation.  Breath sounds are equal.  Heart:  Regular rate and rhythm without murmurs, rubs, or gallops.  Abdomen:   Mild diffuse tenderness without masses or hepatosplenomegaly.  Bowel sounds are present.  No CVA or flank tenderness.  Extremities:  No edema.  No calf tenderness Skin:  No rash present.   ED Course  Procedures  none    Labs Reviewed  POCT CBC W AUTO DIFF ([facility])   WBC 14.0; LY 11.3; MO 2.8; GR 85.9; Hgb 12.6; Platelets 392          MDM   1. Nausea with vomiting; suspect viral gastroenteritis   2. Diarrhea    Rx for Phenergan 25mg  Begin clear liquids (adult rehydration solution  while having diarrhea) until improved, then advance to a BRAT diet.  Then gradually resume a regular diet when tolerated.  Avoid milk products until well.  To decrease diarrhea, mix one heaping tablespoon Citrucel (methylcellulose) in 8 oz water and drink one to three times daily.  When stools become more formed, may take Imodium (loperamide) once or twice daily to decrease stool frequency.  Followup with Family Doctor if not improved in about 3 to 4 days.  If symptoms become significantly worse during the night or over the weekend, proceed to the local emergency room.     Lattie Haw, MD 02/13/13 [phone]

## 2013-03-30 ENCOUNTER — Encounter: Payer: Self-pay | Admitting: Sports Medicine

## 2013-03-30 ENCOUNTER — Ambulatory Visit (INDEPENDENT_AMBULATORY_CARE_PROVIDER_SITE_OTHER): Payer: BC Managed Care – PPO | Admitting: Sports Medicine

## 2013-03-30 VITALS — BP 130/77 | HR 103 | Wt 291.0 lb

## 2013-03-30 DIAGNOSIS — R197 Diarrhea, unspecified: Secondary | ICD-10-CM | POA: Insufficient documentation

## 2013-03-30 DIAGNOSIS — L309 Dermatitis, unspecified: Secondary | ICD-10-CM | POA: Insufficient documentation

## 2013-03-30 DIAGNOSIS — L259 Unspecified contact dermatitis, unspecified cause: Secondary | ICD-10-CM

## 2013-03-30 MED ORDER — DIPHENOXYLATE-ATROPINE 2.5-0.025 MG PO TABS: ORAL_TABLET | ORAL | Status: DC

## 2013-03-30 MED ORDER — TRIAMCINOLONE ACETONIDE 0.5 % EX OINT: 1.0000 "application " | TOPICAL_OINTMENT | Freq: Two times a day (BID) | CUTANEOUS | Status: DC

## 2013-03-30 NOTE — Assessment & Plan Note (Signed)
Declines parenteral treatment. Triamcinolone ointment. Return as needed for this.

## 2013-03-30 NOTE — Progress Notes (Signed)
  Subjective:    CC: Skin complaint  HPI: Eczema: Long history of this, triamcinolone ointment usually works very well, she is out, and desires a refill. She is having an eczema flare on both feet and both hands.  Diarrhea: Has been present for one day, nonbloody, no abdominal pain, no vomiting, no fevers, chills, has not left the country, no rashes other than the eczema, she does have multiple sick contacts. Desires an antidiarrheal. Loperamide was ineffective.  Past medical history, Surgical history, Family history not pertinant except as noted below, Social history, Allergies, and medications have been entered into the medical record, reviewed, and no changes needed.   Review of Systems: No fevers, chills, night sweats, weight loss, chest pain, or shortness of breath.   Objective:    General: Well Developed, well nourished, and in no acute distress.  Neuro: Alert and oriented x3, extra-ocular muscles intact, sensation grossly intact.  HEENT: Normocephalic, atraumatic, pupils equal round reactive to light, neck supple, no masses, no lymphadenopathy, thyroid nonpalpable.  Skin: Warm and dry, eczematous dermatitis noted on both feet and knuckles. Cardiac: Regular rate and rhythm, no murmurs rubs or gallops, no lower extremity edema.  Respiratory: Clear to auscultation bilaterally. Not using accessory muscles, speaking in full sentences. Abdomen: Soft, nontender, nondistended, normal bowel sounds, no palpable masses, no guarding, no rebound tenderness.  Impression and Recommendations:

## 2013-03-30 NOTE — Assessment & Plan Note (Signed)
No red flag warning signs. Appears healthy. Lomotil. Return if no better in one week.

## 2013-08-04 ENCOUNTER — Encounter: Payer: Self-pay | Admitting: Family Medicine

## 2013-08-04 ENCOUNTER — Ambulatory Visit (INDEPENDENT_AMBULATORY_CARE_PROVIDER_SITE_OTHER): Payer: BC Managed Care – PPO | Admitting: Family Medicine

## 2013-08-04 VITALS — BP 124/78 | HR 93 | Temp 97.6°F | Wt 290.0 lb

## 2013-08-04 DIAGNOSIS — R1110 Vomiting, unspecified: Secondary | ICD-10-CM

## 2013-08-04 DIAGNOSIS — N946 Dysmenorrhea, unspecified: Secondary | ICD-10-CM

## 2013-08-04 MED ORDER — NORGESTIMATE-ETH ESTRADIOL 0.25-35 MG-MCG PO TABS: 1.0000 | ORAL_TABLET | Freq: Every day | ORAL | Status: DC

## 2013-08-04 MED ORDER — ONDANSETRON HCL 4 MG PO TABS: 4.0000 mg | ORAL_TABLET | Freq: Three times a day (TID) | ORAL | Status: DC | PRN

## 2013-08-04 NOTE — Progress Notes (Signed)
CC: [name] is a 23 y.o. female is here for diarrhea and vomiting   Subjective: HPI:  Complains of diarrhea that awoke her from sleep at 3 AM this morning. Prior to this she was in her regular state of health. She has had approximately 3-4 bowel movements described as loose and yellow colored without melena nor blood since onset. This has been resolved since taking left over Lomotil. Around 6 AM she began to have nonbloody non-coffee ground appearing emesis. Approximately 3-4 vomiting episodes with lingering nausea since onset which has not been getting better or worse. Overall symptoms appear to be moderate in severity. Nausea is worsened by greasy foods. She was able to keep down apple juice earlier today. She does have some hard to localize abdominal pain which is mild and crampy. Denies fevers, chills, cough, shortness of breath, fevers, chills, cold sweats, night sweats, genitourinary complaints.  No foreign travel, has not eaten undercooked food that she knows of.  She inquires whether or not she can start on oral contraceptives. She's done some research and has heard that this helps with painful menses. She reports years of monthly moderate to severe pelvic cramping during her menses. Improves with ibuprofen nothing else makes better or worse. First day of last menstrual period the third of this month without unprotected sex since. She denies migraines, history of blood clots, nor is she a smoker.   Review Of Systems Outlined In HPI  Past Medical History  Diagnosis Date  . Obesity     No past surgical history on file. Family History  Problem Relation Age of Onset  . Alcohol abuse Father   . Breast cancer      aunt  . Heart attack      grandfather  . Depression      aunt  . Diabetes      grandmother    History   Social History  . Marital Status: Single    Spouse Name: N/A    Number of Children: N/A  . Years of Education: HS   Occupational History  .      [employer]   Social History Main Topics  . Smoking status: Never Smoker   . Smokeless tobacco: Never Used  . Alcohol Use: No  . Drug Use: No  . Sexual Activity: Yes    Birth Control/ Protection: IUD   Other Topics Concern  . Not on file   Social History Narrative   Lives with mother , brother, stepfather.  2 caffeinated drinks per day.  Mom is Elijah [employer]irk[employer]entra [employer].      Objective: BP 124/78  Pulse 93  Temp(Src) 97.6 F (36.4 C) (Oral)  Wt 290 lb (131.543 kg)  General: Alert and Oriented, No Acute Distress HEENT: Pupils equal, round, reactive to light. Conjunctivae clear. Moist mucous membranes pharynx unremarkable Lungs: Clear to auscultation bilaterally, no wheezing/ronchi/rales.  Comfortable work of breathing. Good air movement. Cardiac: Regular rate and rhythm. Normal S1/S2.  No murmurs, rubs, nor gallops.   Abdomen: Obese and soft with normal bowel sounds no guarding or rigidity. Mild tenderness in all 4 quadrants with deep palpation. negative psoas sign.  Extremities: No peripheral edema.  Strong peripheral pulses.  Mental Status: No depression, anxiety, nor agitation. Skin: Warm and dry.  Assessment & Plan: [name] was seen today for diarrhea and vomiting.  Diagnoses and associated orders for this visit:  Vomiting - ondansetron (ZOFRAN) 4 MG tablet; Take 1-2 tablets (4-8 mg total) by mouth every 8 (  eight) hours as needed for nausea or vomiting.  Dysmenorrhea - norgestimate-ethinyl estradiol (SPRINTEC 28) 0.25-35 MG-MCG tablet; Take 1 tablet by mouth daily.    Vomiting: Reassurance provided no red flags for imaging or labs, we'll focus on symptom control with Zofran she received 8 mg of this today in the office. Signs and symptoms requring emergent/urgent reevaluation were discussed with the patient.  Continue to use Lomotil at home for any return of diarrhea  Dysmenorrhea: Discussed risks and benefits of oral contraceptives, she would like to start this to see if it helps  with menstrual pain. Time was taken to answer all questions regarding medication.  25 minutes spent face-to-face during visit today of which at least 50% was counseling or coordinating care regarding: 1. Vomiting   2. Dysmenorrhea      Return if symptoms worsen or fail to improve.

## 2013-08-22 ENCOUNTER — Other Ambulatory Visit: Payer: Self-pay | Admitting: Sports Medicine

## 2013-09-12 ENCOUNTER — Ambulatory Visit (INDEPENDENT_AMBULATORY_CARE_PROVIDER_SITE_OTHER): Payer: BC Managed Care – PPO

## 2013-09-12 ENCOUNTER — Other Ambulatory Visit: Payer: Self-pay | Admitting: *Deleted

## 2013-09-12 ENCOUNTER — Ambulatory Visit (INDEPENDENT_AMBULATORY_CARE_PROVIDER_SITE_OTHER): Payer: BC Managed Care – PPO | Admitting: Family Medicine

## 2013-09-12 ENCOUNTER — Encounter: Payer: Self-pay | Admitting: Family Medicine

## 2013-09-12 VITALS — BP 126/80 | HR 105 | Wt 286.0 lb

## 2013-09-12 DIAGNOSIS — R102 Pelvic and perineal pain: Secondary | ICD-10-CM

## 2013-09-12 DIAGNOSIS — N946 Dysmenorrhea, unspecified: Secondary | ICD-10-CM

## 2013-09-12 DIAGNOSIS — N9489 Other specified conditions associated with female genital organs and menstrual cycle: Secondary | ICD-10-CM

## 2013-09-12 DIAGNOSIS — N949 Unspecified condition associated with female genital organs and menstrual cycle: Secondary | ICD-10-CM

## 2013-09-12 LAB — POCT URINALYSIS DIPSTICK
Glucose, UA: NEGATIVE
Ketones, UA: 15
LEUKOCYTES UA: NEGATIVE
Nitrite, UA: NEGATIVE
PROTEIN UA: 100
Spec Grav, UA: >=1.03
Urobilinogen, UA: 0.2
pH, UA: 6

## 2013-09-12 LAB — POCT URINE PREGNANCY: Preg Test, Ur: NEGATIVE

## 2013-09-12 MED ORDER — DICLOFENAC SODIUM 75 MG PO TBEC: 75.0000 mg | DELAYED_RELEASE_TABLET | Freq: Two times a day (BID) | ORAL | Status: DC | PRN

## 2013-09-12 MED ORDER — DICLOFENAC SODIUM 50 MG PO TBEC: 50.0000 mg | DELAYED_RELEASE_TABLET | Freq: Two times a day (BID) | ORAL | Status: DC

## 2013-09-12 NOTE — Addendum Note (Signed)
Addended by: Nani GasserMETHENEY, Constantino Starace D on: 09/12/2013 12:02 PM   Modules accepted: Orders

## 2013-09-12 NOTE — Progress Notes (Addendum)
   Subjective:    Patient ID: [name], female    DOB: 08/01/1990, 23 y.o.   MRN: [id number]  HPI Patient was seen about one month ago by my partner for acute gastroenteritis but also discussed painful menses. She decided she wanted to start birth control to help regulate her cycles. She currently uses ibuprofen for pain relief. She is sexually active.   She has had severe cramping for about 7 days and started her period about 5 days ago.  She just started the last row of pills on her pack on Sunday. Has tried heat as well. No relief.  Moving and walking makes it worse.  Getting intense cramping.  No fever or chills.  Tried aleve, IBU, pamparin.  Feels very similar to when she had a miscarriage.  She has used diclofenac in the past for menstrual cramping and says it works really well but needs a refill.  Review of Systems     Objective:   Physical Exam  Constitutional: She is oriented to person, place, and time. She appears well-developed and well-nourished.  HENT:  Head: Normocephalic and atraumatic.  Cardiovascular: Normal rate, regular rhythm and normal heart sounds.   Pulmonary/Chest: Effort normal and breath sounds normal.  Abdominal: Soft. Bowel sounds are normal. She exhibits no distension and no mass. There is tenderness. There is no rebound and no guarding.    Area of tenderness  Neurological: She is alert and oriented to person, place, and time.  Skin: Skin is warm and dry.  Psychiatric: She has a normal mood and affect. Her behavior is normal.          Assessment & Plan:  Pelvic pain/cramping out of proportion to her normal periods.  No fever.  UPT is neg.  Will do UA as well. Will send for US to eval for pregnancy.  Consider STD testing as well. We'll give her urine cup for urine GC and chlam testing.  Will refill diclofenac since it seems to work best for her.

## 2013-09-12 NOTE — Addendum Note (Signed)
Addended by: Deno EtienneBARKLEY, Tenaya Hilyer L on: 09/12/2013 11:55 AM   Modules accepted: Orders

## 2013-09-13 ENCOUNTER — Telehealth: Payer: Self-pay

## 2013-09-13 NOTE — Telephone Encounter (Signed)
[name] called back and she reports severe increase in pain and she started vomiting. I advised her to go to the ED to be evaluated for the increase in pain and vomiting.

## 2013-09-13 NOTE — Telephone Encounter (Signed)
I agree, still needs to be tested for PID and may need CT abdomen.

## 2013-09-13 NOTE — Telephone Encounter (Signed)
Larrisa called and left message about her pain being worse today. I called and left a message for patient to return call.

## 2013-09-15 ENCOUNTER — Telehealth: Payer: Self-pay | Admitting: Family Medicine

## 2013-09-15 NOTE — Telephone Encounter (Signed)
lvm informing pt that Dr. Linford Arnold was wanting to see how she was feeling and for her to return my call.Deno Etienne

## 2013-09-15 NOTE — Telephone Encounter (Signed)
Please call pt and see how she is doing

## 2013-12-18 ENCOUNTER — Encounter: Payer: Self-pay | Admitting: Physician Assistant

## 2013-12-18 ENCOUNTER — Ambulatory Visit (INDEPENDENT_AMBULATORY_CARE_PROVIDER_SITE_OTHER): Payer: BC Managed Care – PPO

## 2013-12-18 ENCOUNTER — Ambulatory Visit (INDEPENDENT_AMBULATORY_CARE_PROVIDER_SITE_OTHER): Payer: BC Managed Care – PPO | Admitting: Physician Assistant

## 2013-12-18 VITALS — BP 128/92 | HR 95 | Wt 292.0 lb

## 2013-12-18 DIAGNOSIS — M7730 Calcaneal spur, unspecified foot: Secondary | ICD-10-CM | POA: Diagnosis not present

## 2013-12-18 DIAGNOSIS — M722 Plantar fascial fibromatosis: Secondary | ICD-10-CM

## 2013-12-18 DIAGNOSIS — M79609 Pain in unspecified limb: Secondary | ICD-10-CM

## 2013-12-18 DIAGNOSIS — M7732 Calcaneal spur, left foot: Secondary | ICD-10-CM

## 2013-12-18 MED ORDER — MELOXICAM 15 MG PO TABS: ORAL_TABLET | ORAL | Status: DC

## 2013-12-18 MED ORDER — KETOROLAC TROMETHAMINE 60 MG/2ML IM SOLN
60.0000 mg | Freq: Once | INTRAMUSCULAR | Status: AC
  Administered 2013-12-18: 60 mg via INTRAMUSCULAR

## 2013-12-18 MED ORDER — TRAMADOL HCL 50 MG PO TABS: ORAL_TABLET | ORAL | Status: DC

## 2013-12-18 NOTE — Patient Instructions (Addendum)
Plantar Fasciitis Plantar fasciitis is a common condition that causes foot pain. It is soreness (inflammation) of the band of tough fibrous tissue on the bottom of the foot that runs from the heel bone (calcaneus) to the ball of the foot. The cause of this soreness may be from excessive standing, poor fitting shoes, running on hard surfaces, being overweight, having an abnormal walk, or overuse (this is common in runners) of the painful foot or feet. It is also common in aerobic exercise dancers and ballet dancers. SYMPTOMS  Most people with plantar fasciitis complain of:  Severe pain in the morning on the bottom of their foot especially when taking the first steps out of bed. This pain recedes after a few minutes of walking.  Severe pain is experienced also during walking following a long period of inactivity.  Pain is worse when walking barefoot or up stairs DIAGNOSIS   Your caregiver will diagnose this condition by examining and feeling your foot.  Special tests such as X-rays of your foot, are usually not needed. PREVENTION   Consult a sports medicine professional before beginning a new exercise program.  Walking programs offer a good workout. With walking there is a lower chance of overuse injuries common to runners. There is less impact and less jarring of the joints.  Begin all new exercise programs slowly. If problems or pain develop, decrease the amount of time or distance until you are at a comfortable level.  Wear good shoes and replace them regularly.  Stretch your foot and the heel cords at the back of the ankle (Achilles tendon) both before and after exercise.  Run or exercise on even surfaces that are not hard. For example, asphalt is better than pavement.  Do not run barefoot on hard surfaces.  If using a treadmill, vary the incline.  Do not continue to workout if you have foot or joint problems. Seek professional help if they do not improve. HOME CARE INSTRUCTIONS     Avoid activities that cause you pain until you recover.  Use ice or cold packs on the problem or painful areas after working out.  Only take over-the-counter or prescription medicines for pain, discomfort, or fever as directed by your caregiver.  Soft shoe inserts or athletic shoes with air or gel sole cushions may be helpful.  If problems continue or become more severe, consult a sports medicine caregiver or your own health care provider. Cortisone is a potent anti-inflammatory medication that may be injected into the painful area. You can discuss this treatment with your caregiver. MAKE SURE YOU:   Understand these instructions.  Will watch your condition.  Will get help right away if you are not doing well or get worse. Document Released: 01/06/2001 Document Revised: 07/06/2011 Document Reviewed: 03/07/2008 [employer] Patient Information 2015 [employer]. This information is not intended to replace advice given to you by your health care provider. Make sure you discuss any questions you have with your health care provider.   Plantar Fasciitis (Heel Spur Syndrome) with Rehab The plantar fascia is a fibrous, ligament-like, soft-tissue structure that spans the bottom of the foot. Plantar fasciitis is a condition that causes pain in the foot due to inflammation of the tissue. SYMPTOMS   Pain and tenderness on the underneath side of the foot.  Pain that worsens with standing or walking. CAUSES  Plantar fasciitis is caused by irritation and injury to the plantar fascia on the underneath side of the foot. Common mechanisms of injury include:    Direct trauma to bottom of the foot.  Damage to a small nerve that runs under the foot where the main fascia attaches to the heel bone.  Stress placed on the plantar fascia due to bone spurs. RISK INCREASES WITH:   Activities that place stress on the plantar fascia (running, jumping, pivoting, or cutting).  Poor strength and  flexibility.  Improperly fitted shoes.  Tight calf muscles.  Flat feet.  Failure to warm-up properly before activity.  Obesity. PREVENTION  Warm up and stretch properly before activity.  Allow for adequate recovery between workouts.  Maintain physical fitness:  Strength, flexibility, and endurance.  Cardiovascular fitness.  Maintain a health body weight.  Avoid stress on the plantar fascia.  Wear properly fitted shoes, including arch supports for individuals who have flat feet. PROGNOSIS  If treated properly, then the symptoms of plantar fasciitis usually resolve without surgery. However, occasionally surgery is necessary. RELATED COMPLICATIONS   Recurrent symptoms that may result in a chronic condition.  Problems of the lower back that are caused by compensating for the injury, such as limping.  Pain or weakness of the foot during push-off following surgery.  Chronic inflammation, scarring, and partial or complete fascia tear, occurring more often from repeated injections. TREATMENT  Treatment initially involves the use of ice and medication to help reduce pain and inflammation. The use of strengthening and stretching exercises may help reduce pain with activity, especially stretches of the Achilles tendon. These exercises may be performed at home or with a therapist. Your caregiver may recommend that you use heel cups of arch supports to help reduce stress on the plantar fascia. Occasionally, corticosteroid injections are given to reduce inflammation. If symptoms persist for greater than 6 months despite non-surgical (conservative), then surgery may be recommended.  MEDICATION   If pain medication is necessary, then nonsteroidal anti-inflammatory medications, such as aspirin and ibuprofen, or other minor pain relievers, such as acetaminophen, are often recommended.  Do not take pain medication within 7 days before surgery.  Prescription pain relievers may be given if  deemed necessary by your caregiver. Use only as directed and only as much as you need.  Corticosteroid injections may be given by your caregiver. These injections should be reserved for the most serious cases, because they may only be given a certain number of times. HEAT AND COLD  Cold treatment (icing) relieves pain and reduces inflammation. Cold treatment should be applied for 10 to 15 minutes every 2 to 3 hours for inflammation and pain and immediately after any activity that aggravates your symptoms. Use ice packs or massage the area with a piece of ice (ice massage).  Heat treatment may be used prior to performing the stretching and strengthening activities prescribed by your caregiver, physical therapist, or athletic trainer. Use a heat pack or soak the injury in warm water. SEEK IMMEDIATE MEDICAL CARE IF:  Treatment seems to offer no benefit, or the condition worsens.  Any medications produce adverse side effects. EXERCISES RANGE OF MOTION (ROM) AND STRETCHING EXERCISES - Plantar Fasciitis (Heel Spur Syndrome) These exercises may help you when beginning to rehabilitate your injury. Your symptoms may resolve with or without further involvement from your physician, physical therapist or athletic trainer. While completing these exercises, remember:   Restoring tissue flexibility helps normal motion to return to the joints. This allows healthier, less painful movement and activity.  An effective stretch should be held for at least 30 seconds.  A stretch should never be painful. You should   only feel a gentle lengthening or release in the stretched tissue. RANGE OF MOTION - Toe Extension, Flexion  Sit with your right / left leg crossed over your opposite knee.  Grasp your toes and gently pull them back toward the top of your foot. You should feel a stretch on the bottom of your toes and/or foot.  Hold this stretch for __________ seconds.  Now, gently pull your toes toward the bottom  of your foot. You should feel a stretch on the top of your toes and or foot.  Hold this stretch for __________ seconds. Repeat __________ times. Complete this stretch __________ times per day.  RANGE OF MOTION - Ankle Dorsiflexion, Active Assisted  Remove shoes and sit on a chair that is preferably not on a carpeted surface.  Place right / left foot under knee. Extend your opposite leg for support.  Keeping your heel down, slide your right / left foot back toward the chair until you feel a stretch at your ankle or calf. If you do not feel a stretch, slide your bottom forward to the edge of the chair, while still keeping your heel down.  Hold this stretch for __________ seconds. Repeat __________ times. Complete this stretch __________ times per day.  STRETCH - Gastroc, Standing  Place hands on wall.  Extend right / left leg, keeping the front knee somewhat bent.  Slightly point your toes inward on your back foot.  Keeping your right / left heel on the floor and your knee straight, shift your weight toward the wall, not allowing your back to arch.  You should feel a gentle stretch in the right / left calf. Hold this position for __________ seconds. Repeat __________ times. Complete this stretch __________ times per day. STRETCH - Soleus, Standing  Place hands on wall.  Extend right / left leg, keeping the other knee somewhat bent.  Slightly point your toes inward on your back foot.  Keep your right / left heel on the floor, bend your back knee, and slightly shift your weight over the back leg so that you feel a gentle stretch deep in your back calf.  Hold this position for __________ seconds. Repeat __________ times. Complete this stretch __________ times per day. STRETCH - Gastrocsoleus, Standing  Note: This exercise can place a lot of stress on your foot and ankle. Please complete this exercise only if specifically instructed by your caregiver.   Place the ball of your right  / left foot on a step, keeping your other foot firmly on the same step.  Hold on to the wall or a rail for balance.  Slowly lift your other foot, allowing your body weight to press your heel down over the edge of the step.  You should feel a stretch in your right / left calf.  Hold this position for __________ seconds.  Repeat this exercise with a slight bend in your right / left knee. Repeat __________ times. Complete this stretch __________ times per day.  STRENGTHENING EXERCISES - Plantar Fasciitis (Heel Spur Syndrome)  These exercises may help you when beginning to rehabilitate your injury. They may resolve your symptoms with or without further involvement from your physician, physical therapist or athletic trainer. While completing these exercises, remember:   Muscles can gain both the endurance and the strength needed for everyday activities through controlled exercises.  Complete these exercises as instructed by your physician, physical therapist or athletic trainer. Progress the resistance and repetitions only as guided. STRENGTH - Towel   Curls  Sit in a chair positioned on a non-carpeted surface.  Place your foot on a towel, keeping your heel on the floor.  Pull the towel toward your heel by only curling your toes. Keep your heel on the floor.  If instructed by your physician, physical therapist or athletic trainer, add ____________________ at the end of the towel. Repeat __________ times. Complete this exercise __________ times per day. STRENGTH - Ankle Inversion  Secure one end of a rubber exercise band/tubing to a fixed object (table, pole). Loop the other end around your foot just before your toes.  Place your fists between your knees. This will focus your strengthening at your ankle.  Slowly, pull your big toe up and in, making sure the band/tubing is positioned to resist the entire motion.  Hold this position for __________ seconds.  Have your muscles resist the  band/tubing as it slowly pulls your foot back to the starting position. Repeat __________ times. Complete this exercises __________ times per day.  Document Released: 04/13/2005 Document Revised: 07/06/2011 Document Reviewed: 07/26/2008 Exit[employer] Patient Information 2015 [employer]. This information is not intended to replace advice given to you by your health care provider. Make sure you discuss any questions you have with your health care provider.   

## 2013-12-19 DIAGNOSIS — R200 Anesthesia of skin: Secondary | ICD-10-CM | POA: Insufficient documentation

## 2013-12-19 DIAGNOSIS — M7730 Calcaneal spur, unspecified foot: Secondary | ICD-10-CM | POA: Insufficient documentation

## 2013-12-19 NOTE — Progress Notes (Signed)
   Subjective:    Patient ID: [name], female    DOB: 02/24/1991, 23 y.o.   MRN: [id number]  HPI Pt is a 23 yo female who presents to the clinic with bilateral feet pain. Her right is worse than her left today. She has been diagnosed with plantar fasciitis before. He did resolve but started back up 2 weeks ago. Notthing seems to make better. Taken some aleve and tried to stay off of them. She reports pain in right foot to be 10/10. No known trauma.    Review of Systems  All other systems reviewed and are negative.      Objective:   Physical Exam  Constitutional: She is oriented to person, place, and time. She appears well-developed and well-nourished.  HENT:  Head: Normocephalic and atraumatic.  Cardiovascular: Normal rate, regular rhythm and normal heart sounds.   Pulmonary/Chest: Effort normal and breath sounds normal.  Musculoskeletal:  Pain to palpation over right heel brought pt to tears.  Pain with plantar flexion of right foot.  Pain to palpation up fascia.   Discomfort over left heel and fascia to palpation. Discomfort with plantar flexion of left foot.    Neurological: She is alert and oriented to person, place, and time.  Skin: Skin is dry.  Psychiatric: She has a normal mood and affect. Her behavior is normal.          Assessment & Plan:  Bilateral plantar fasciitis-. Will get xrays today. toradol  IM given today. Start mobic for next 2 weeks then as needed for pain. Discussed frozen water bottle. Wear very supportive shoes. Tramadol given for breakthrough pain. If not improving or worsening follow up with Dr. Karie Schwalbe for orthotic and or injection.

## 2013-12-22 ENCOUNTER — Ambulatory Visit (INDEPENDENT_AMBULATORY_CARE_PROVIDER_SITE_OTHER): Payer: BC Managed Care – PPO | Admitting: Family Medicine

## 2013-12-22 ENCOUNTER — Encounter: Payer: Self-pay | Admitting: Family Medicine

## 2013-12-22 VITALS — BP 122/76 | HR 105 | Wt 293.0 lb

## 2013-12-22 DIAGNOSIS — M722 Plantar fascial fibromatosis: Secondary | ICD-10-CM | POA: Diagnosis not present

## 2013-12-22 DIAGNOSIS — G4700 Insomnia, unspecified: Secondary | ICD-10-CM | POA: Insufficient documentation

## 2013-12-22 MED ORDER — TRAZODONE HCL 50 MG PO TABS: 25.0000 mg | ORAL_TABLET | Freq: Every evening | ORAL | Status: DC | PRN

## 2013-12-22 MED ORDER — HYDROCODONE-ACETAMINOPHEN 5-325 MG PO TABS: 1.0000 | ORAL_TABLET | Freq: Four times a day (QID) | ORAL | Status: DC | PRN

## 2013-12-22 NOTE — Patient Instructions (Signed)
Night splits can be helpful.   Continue icy massage and stretches.

## 2013-12-22 NOTE — Progress Notes (Signed)
   Subjective:    Patient ID: [name], female    DOB: Sep 01, 1990, 23 y.o.   MRN: [id number]  HPI pt reports that the pain has now started in her left foot. she said it feels lake a "crawling sensation" also she has been doing the stretches, and using the frozen ice bottle which helps some but only provides short term relief. she said that the tramadol does little for the pain and the mobic works better.   She also complains of insomnia. She says for the last couple years she's had difficulty falling asleep. When she is asleep she can usually stay asleep. She feels m waters. She thinks about things in her past and thinks they're gone during the daytime. She completely avoids caffeine. She says she goes to bed at the same time every night. She doesn't watch TV in the bedroom. She says it does come in spurts at times.  Review of Systems     Objective:   Physical Exam  Constitutional: She appears well-developed and well-nourished.  HENT:  Head: Normocephalic and atraumatic.  Eyes: Conjunctivae are normal. Pupils are equal, round, and reactive to light.  Musculoskeletal:  Right foot-tender over the heel pad. Nontender over the Achilles or posterior heel. She is a little bit of tenderness going into the arch. No specific metatarsal tenderness. No pain over the toes. Strength in all directions of the ankle and great toe is 5 out of 5. Dorsal pedal pulses 2+.  Skin: Skin is warm and dry.  Psychiatric: She has a normal mood and affect. Her behavior is normal.          Assessment & Plan:  Plantar fasciitis-she really is on the right treatment with the stretches, icing and anti-inflammatory. She did bring in the rest of her tramadol. She says was not effective at all even when she increased to 2 tabs. I gave her a small quantity of hydrocodone. I want her to be off of her feet this weekend. I gave her a work note to not return to work until Wednesday and to limit her standing when she does  return to work. I would like her to see Dr. Karie Schwalbe. in about 3 weeks to get fit for custom orthotics. Could also consider steroid injection if she's not improving.  Insomnia-discussed different treatment options. It sounds like she actually has pretty good sleep hygiene. Will start trazodone. Can increase it to 2 tabs if needed but recommend start with half a tab. Can be used as needed.

## 2014-01-17 ENCOUNTER — Ambulatory Visit (INDEPENDENT_AMBULATORY_CARE_PROVIDER_SITE_OTHER): Payer: BC Managed Care – PPO | Admitting: Sports Medicine

## 2014-01-17 VITALS — BP 119/74 | HR 100 | Ht 63.0 in | Wt 292.0 lb

## 2014-01-17 DIAGNOSIS — R200 Anesthesia of skin: Secondary | ICD-10-CM

## 2014-01-17 DIAGNOSIS — R209 Unspecified disturbances of skin sensation: Secondary | ICD-10-CM

## 2014-01-17 LAB — CBC
HCT: 38.6 % (ref 36.0–46.0)
Hemoglobin: 12.4 g/dL (ref 12.0–15.0)
MCH: 25.1 pg — ABNORMAL LOW (ref 26.0–34.0)
MCHC: 32.1 g/dL (ref 30.0–36.0)
MCV: 78.1 fL (ref 78.0–100.0)
Platelets: 423 10*3/uL — ABNORMAL HIGH (ref 150–400)
RBC: 4.94 MIL/uL (ref 3.87–5.11)
RDW: 15.2 % (ref 11.5–15.5)
WBC: 10.4 K/uL (ref 4.0–10.5)

## 2014-01-17 MED ORDER — GABAPENTIN 300 MG PO CAPS: ORAL_CAPSULE | ORAL | Status: DC

## 2014-01-17 MED ORDER — GABAPENTIN 600 MG PO TABS: ORAL_TABLET | ORAL | Status: DC

## 2014-01-17 NOTE — Progress Notes (Signed)
Patient ID: [name], female   DOB: [birth year], 23 y.o.   MRN: [id number]   Subjective:    I'm seeing this patient as a consultation for: [name]  CC: Bilateral foot pain  HPI: [name] is a very pleasant 23 year old female with history of obesity who presents with several months of pain on the plantar surface of her feet bilaterally and one week of numbness on the base of her right foot. She reports that the pain is worse after sitting for prolonged periods or after being on her feet for an extended period. Reports that the numbness comes and goes sporadically. No known history of diabetes. No pain/radicular symptoms in the leg above the ankle or in the back. NSAIDs have been successful in partially relieving her pain; however, she is taking hydrocodone at night which completely relieves the pain.  Past medical history, Surgical history, Family history not pertinant except as noted below, Social history, Allergies, and medications have been entered into the medical record, reviewed, and no changes needed.   Review of Systems: No body aches, joint swelling, muscle aches, polydipsia, polyphagia, weight changes.  Objective:   General: Well developed, well nourished, and in no acute distress.  Neuro/Psych: Alert and oriented x3, extra-ocular muscles intact, able to move all 4 extremities, sensation grossly intact. Skin: Warm and dry, no rashes noted.  Respiratory: Not using accessory muscles, speaking in full sentences, trachea midline.  Cardiovascular: Pulses palpable, no extremity edema. Abdomen: Does not appear distended.  Right Foot: No visible erythema or swelling. Range of motion is full in all directions. Strength is 5/5 in all directions. No hallux valgus. Pes planus. Some callus noted over the heel. No pain over the navicular prominence, or base of fifth metatarsal. No tenderness to palpation of the calcaneal insertion of plantar fascia. No pain at the  Achilles insertion. No pain over the calcaneal bursa. No pain of the retrocalcaneal bursa. No discrete tenderness to palpation over the tarsals, metatarsals, or phalanges. Tenderness to palpation over the arch of her foot. No hallux rigidus or limitus. No tenderness palpation over interphalangeal joints. No pain with compression of the metatarsal heads. Neurovascularly intact distally. Altered sensation over the plantar surface of the foot. Positive tarsal tunnel Tinel sign.  Left Foot: No visible erythema or swelling. Range of motion is full in all directions. Strength is 5/5 in all directions. No hallux valgus. Pes planus. Some callus noted over the heel. No pain over the navicular prominence, or base of fifth metatarsal. No tenderness to palpation of the calcaneal insertion of plantar fascia. No pain at the Achilles insertion. No pain over the calcaneal bursa. No pain of the retrocalcaneal bursa. No discrete tenderness to palpation over the tarsals, metatarsals, or phalanges. Tenderness to palpation over the arch of her foot. No hallux rigidus or limitus. No tenderness palpation over interphalangeal joints. No pain with compression of the metatarsal heads. Neurovascularly intact distally.  Impression and Recommendations:   This case required medical decision making of moderate complexity. 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 This is not likely to be plantar fasciitis, as she does not have pain at the calcaneal insertion of the plantar fascia. - Formal physical therapy - Continue NSAIDs - Return for custom orthotics - Adding low-dose gabapentin - Check hemoglobin  A1c, CBC, CMET, TSH, and vitamin B12

## 2014-01-17 NOTE — Assessment & Plan Note (Signed)
Interestingly there is numbness on the plantar aspect of both feet sparing the dorsal foot without any radicular component or back pain. She does not have any pain at the calcaneal insertion of the plantar fascia. She does have a positive tarsal tunnel Tinel sign. 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 Formal physical therapy, continue NSAIDs, return for custom orthotics, adding low-dose gabapentin. I am going to check a hemoglobin A1c, CBC, CMET, TSH, and vitamin B12.

## 2014-01-18 LAB — HEMOGLOBIN A1C
Hgb A1c MFr Bld: 6.1 % — ABNORMAL HIGH (ref <5.7)
Mean Plasma Glucose: 128 mg/dL — ABNORMAL HIGH (ref <117)

## 2014-01-18 LAB — TSH: TSH: 0.819 u[IU]/mL (ref 0.350–4.500)

## 2014-01-18 LAB — COMPREHENSIVE METABOLIC PANEL
Albumin: 3.9 g/dL (ref 3.5–5.2)
Alkaline Phosphatase: 87 U/L (ref 39–117)
CO2: 25 mEq/L (ref 19–32)
Creat: 0.65 mg/dL (ref 0.50–1.10)
Glucose, Bld: 82 mg/dL (ref 70–99)
Sodium: 140 mEq/L (ref 135–145)
Total Bilirubin: 0.8 mg/dL (ref 0.2–1.2)
Total Protein: 7.6 g/dL (ref 6.0–8.3)

## 2014-01-18 LAB — COMPREHENSIVE METABOLIC PANEL WITH GFR
ALT: 12 U/L (ref 0–35)
AST: 14 U/L (ref 0–37)
BUN: 10 mg/dL (ref 6–23)
Calcium: 9.8 mg/dL (ref 8.4–10.5)
Chloride: 105 meq/L (ref 96–112)
Potassium: 4.4 meq/L (ref 3.5–5.3)

## 2014-01-18 LAB — VITAMIN B12: Vitamin B-12: 795 pg/mL (ref 211–911)

## 2014-01-23 ENCOUNTER — Encounter: Payer: BC Managed Care – PPO | Admitting: Sports Medicine

## 2014-01-29 ENCOUNTER — Ambulatory Visit: Payer: BC Managed Care – PPO | Admitting: Physical Therapy

## 2014-01-31 ENCOUNTER — Encounter: Payer: BC Managed Care – PPO | Admitting: Sports Medicine

## 2014-02-18 ENCOUNTER — Other Ambulatory Visit: Payer: Self-pay | Admitting: Sports Medicine

## 2014-02-26 ENCOUNTER — Encounter: Payer: Self-pay | Admitting: Family Medicine

## 2014-03-02 ENCOUNTER — Encounter: Payer: Self-pay | Admitting: *Deleted

## 2014-03-02 ENCOUNTER — Emergency Department (INDEPENDENT_AMBULATORY_CARE_PROVIDER_SITE_OTHER)
Admission: EM | Admit: 2014-03-02 | Discharge: 2014-03-02 | Disposition: A | Discharge status: Home / Self Care | Payer: BC Managed Care – PPO | Source: Home / Self Care | Attending: Family Medicine | Admitting: Family Medicine

## 2014-03-02 DIAGNOSIS — L03012 Cellulitis of left finger: Secondary | ICD-10-CM

## 2014-03-02 DIAGNOSIS — IMO0001 Reserved for inherently not codable concepts without codable children: Secondary | ICD-10-CM

## 2014-03-02 HISTORY — DX: Type 2 diabetes mellitus without complications: E11.9

## 2014-03-02 HISTORY — DX: Essential (primary) hypertension: I10

## 2014-03-02 LAB — POCT URINE PREGNANCY: Preg Test, Ur: NEGATIVE

## 2014-03-02 MED ORDER — DOXYCYCLINE HYCLATE 100 MG PO CAPS: 100.0000 mg | ORAL_CAPSULE | Freq: Two times a day (BID) | ORAL | Status: DC

## 2014-03-02 MED ORDER — CEPHALEXIN 500 MG PO CAPS: 500.0000 mg | ORAL_CAPSULE | Freq: Two times a day (BID) | ORAL | Status: DC

## 2014-03-02 NOTE — ED Notes (Signed)
Pt c/o LT 4th finger swollen and red around her fingernail x 6 days. She reports taking an old rx for Bactrim x 4 days with no improvement.

## 2014-03-02 NOTE — Discharge Instructions (Signed)
Thank you for coming in today. Take Keflex 4 times daily and doxycycline twice daily for one week Used birth control taking these medications. Follow-up with your primary care doctor if not getting better. Come back if worse.  Paronychia Paronychia is an inflammatory reaction involving the folds of the skin surrounding the fingernail. This is commonly caused by an infection in the skin around a nail. The most common cause of paronychia is frequent wetting of the hands (as seen with bartenders, food servers, nurses or others who wet their hands). This makes the skin around the fingernail susceptible to infection by bacteria (germs) or fungus. Other predisposing factors are:  Aggressive manicuring.  Nail biting.  Thumb sucking. The most common cause is a staphylococcal (a type of germ) infection, or a fungal (Candida) infection. When caused by a germ, it usually comes on suddenly with redness, swelling, pus and is often painful. It may get under the nail and form an abscess (collection of pus), or form an abscess around the nail. If the nail itself is infected with a fungus, the treatment is usually prolonged and may require oral medicine for up to one year. Your caregiver will determine the length of time treatment is required. The paronychia caused by bacteria (germs) may largely be avoided by not pulling on hangnails or picking at cuticles. When the infection occurs at the tips of the finger it is called felon. When the cause of paronychia is from the herpes simplex virus (HSV) it is called herpetic whitlow. TREATMENT  When an abscess is present treatment is often incision and drainage. This means that the abscess must be cut open so the pus can get out. When this is done, the following home care instructions should be followed. HOME CARE INSTRUCTIONS   It is important to keep the affected fingers very dry. Rubber or plastic gloves over cotton gloves should be used whenever the hand must be  placed in water.  Keep wound clean, dry and dressed as suggested by your caregiver between warm soaks or warm compresses.  Soak in warm water for fifteen to twenty minutes three to four times per day for bacterial infections. Fungal infections are very difficult to treat, so often require treatment for long periods of time.  For bacterial (germ) infections take antibiotics (medicine which kill germs) as directed and finish the prescription, even if the problem appears to be solved before the medicine is gone.  Only take over-the-counter or prescription medicines for pain, discomfort, or fever as directed by your caregiver. SEEK IMMEDIATE MEDICAL CARE IF:  You have redness, swelling, or increasing pain in the wound.  You notice pus coming from the wound.  You have a fever.  You notice a bad smell coming from the wound or dressing. Document Released: 10/07/2000 Document Revised: 07/06/2011 Document Reviewed: 06/08/2008 [facility]Exit[facility] Patient Information 2015 [employer], [place]. This information is not intended to replace advice given to you by your health care provider. Make sure you discuss any questions you have with your health care provider.

## 2014-03-02 NOTE — ED Provider Notes (Signed)
[name] is a 23 y.o. female who presents to Urgent Care today for Paronychia. Patient notes a left ulnar fourth digit finger infection present for the last several days. She has tried warm water soaks and an old prescription of Bactrim which has not helped. She denies any fevers or chills nausea vomiting or diarrhea. This pregnancy by using birth control pills but is missed several pills of the last week.    Past Medical History  Diagnosis Date  . Obesity   . Diabetes mellitus without complication   . Hypertension    History reviewed. No pertinent past surgical history. History  Substance Use Topics  . Smoking status: Never Smoker   . Smokeless tobacco: Never Used  . Alcohol Use: No   ROS as above Medications: No current facility-administered medications for this encounter.   Current Outpatient Prescriptions  Medication Sig Dispense Refill  . cephALEXin (KEFLEX) 500 MG capsule Take 1 capsule (500 mg total) by mouth 2 (two) times daily. 28 capsule 0  . doxycycline (VIBRAMYCIN) 100 MG capsule Take 1 capsule (100 mg total) by mouth 2 (two) times daily. 14 capsule 0  . meloxicam (MOBIC) 15 MG tablet Take one tablet daily for 2 weeks then as needed for pain. 30 tablet 1  . norgestimate-ethinyl estradiol (SPRINTEC 28) 0.25-35 MG-MCG tablet Take 1 tablet by mouth daily. 1 Package 11  . [DISCONTINUED] gabapentin (NEURONTIN) 600 MG tablet One half tab at bedtime for a week then twice a day for a week then 3 times a day 60 tablet 0  . [DISCONTINUED] traZODone (DESYREL) 50 MG tablet Take 0.5-2 tablets (25-100 mg total) by mouth at bedtime as needed for sleep. 60 tablet 3   Allergies  Allergen Reactions  . Shellfish Allergy      Exam:  BP 128/87 mmHg  Pulse 94  Temp(Src) 98.5 F (36.9 C) (Oral)  Resp 16  Ht 5\' 5"  (1.651 m)  Wt 292 lb (132.45 kg)  BMI 48.59 kg/m2  SpO2 97%  LMP 02/10/2014 Gen: Well NAD Left fourth digit hand: Erythematous and tender along the ulnar aspect of  the nail border. No fluctuance or abscess visible or palpable. Hand motion Refill and sensation are intact. Strength is intact.  Results for orders placed or performed during the hospital encounter of 03/02/14 (from the past 24 hour(s))  POCT urine pregnancy     Status: None   Collection Time: 03/02/14  9:23 AM  Result Value Ref Range   Preg Test, Ur Negative    No results found.  Assessment and Plan: 23 y.o. female with paronychia not yet an abscess. Treatment with doxycycline and Keflex. return as needed.  Discussed warning signs or symptoms. Please see discharge instructions. Patient expresses understanding.     Rodolph BongEvan S Corey, MD 03/02/14 [phone]

## 2014-04-02 ENCOUNTER — Other Ambulatory Visit: Payer: Self-pay | Admitting: Physician Assistant

## 2014-05-05 ENCOUNTER — Other Ambulatory Visit: Payer: Self-pay | Admitting: Sports Medicine

## 2014-06-25 ENCOUNTER — Ambulatory Visit: Payer: Self-pay | Admitting: Family Medicine

## 2014-06-26 ENCOUNTER — Ambulatory Visit: Payer: Self-pay | Admitting: Family Medicine

## 2014-08-07 ENCOUNTER — Other Ambulatory Visit: Payer: Self-pay | Admitting: Family Medicine

## 2014-08-17 ENCOUNTER — Ambulatory Visit: Payer: Self-pay | Admitting: Physician Assistant

## 2014-10-04 ENCOUNTER — Ambulatory Visit (INDEPENDENT_AMBULATORY_CARE_PROVIDER_SITE_OTHER): Payer: BLUE CROSS/BLUE SHIELD | Admitting: Family Medicine

## 2014-10-04 ENCOUNTER — Ambulatory Visit: Payer: Self-pay | Admitting: Sports Medicine

## 2014-10-04 ENCOUNTER — Encounter: Payer: Self-pay | Admitting: Family Medicine

## 2014-10-04 VITALS — BP 130/84 | HR 128 | Ht 65.0 in | Wt 315.0 lb

## 2014-10-04 DIAGNOSIS — H109 Unspecified conjunctivitis: Secondary | ICD-10-CM

## 2014-10-04 MED ORDER — POLYMYXIN B-TRIMETHOPRIM 10000-0.1 UNIT/ML-% OP SOLN: 2.0000 [drp] | OPHTHALMIC | Status: DC

## 2014-10-04 NOTE — Patient Instructions (Signed)

## 2014-10-04 NOTE — Progress Notes (Signed)
   Subjective:    Patient ID: [name], female    DOB: 1990/06/27, 24 y.o.   MRN: [id number]  HPI 4 days of left eye redness and irritation.  Yellow drainage for 3 days. No URI sxs.  No vision changes.     Review of Systems     Objective:   Physical Exam  Constitutional: She appears well-developed and well-nourished.  HENT:  Head: Normocephalic and atraumatic.  Nose: Nose normal.  Mouth/Throat: Oropharynx is clear and moist. No oropharyngeal exudate.  Erythema of the conjunctiva and slcera on the left eye.  Some watering.  No lid edema.   Neck: Neck supple. No thyromegaly present.  Lymphadenopathy:    She has no cervical adenopathy.  Skin: Skin is warm and dry.  Psychiatric: She has a normal mood and affect. Her behavior is normal.          Assessment & Plan:

## 2014-10-11 ENCOUNTER — Other Ambulatory Visit: Payer: Self-pay | Admitting: *Deleted

## 2014-10-11 DIAGNOSIS — N946 Dysmenorrhea, unspecified: Secondary | ICD-10-CM

## 2014-10-11 MED ORDER — NORGESTIMATE-ETH ESTRADIOL 0.25-35 MG-MCG PO TABS: 1.0000 | ORAL_TABLET | Freq: Every day | ORAL | Status: DC

## 2014-10-15 ENCOUNTER — Ambulatory Visit (INDEPENDENT_AMBULATORY_CARE_PROVIDER_SITE_OTHER): Payer: BLUE CROSS/BLUE SHIELD | Admitting: Family Medicine

## 2014-10-15 ENCOUNTER — Encounter: Payer: Self-pay | Admitting: Family Medicine

## 2014-10-15 DIAGNOSIS — Z23 Encounter for immunization: Secondary | ICD-10-CM | POA: Diagnosis not present

## 2014-10-15 DIAGNOSIS — R635 Abnormal weight gain: Secondary | ICD-10-CM | POA: Diagnosis not present

## 2014-10-15 DIAGNOSIS — N946 Dysmenorrhea, unspecified: Secondary | ICD-10-CM | POA: Diagnosis not present

## 2014-10-15 MED ORDER — PHENTERMINE HCL 37.5 MG PO CAPS: 37.5000 mg | ORAL_CAPSULE | ORAL | Status: DC

## 2014-10-15 MED ORDER — TRIAMCINOLONE ACETONIDE 0.5 % EX OINT: TOPICAL_OINTMENT | CUTANEOUS | Status: DC

## 2014-10-15 MED ORDER — NORGESTIMATE-ETH ESTRADIOL 0.25-35 MG-MCG PO TABS: 1.0000 | ORAL_TABLET | Freq: Every day | ORAL | Status: DC

## 2014-10-15 NOTE — Progress Notes (Signed)
   Subjective:    Patient ID: [name], female    DOB: 07/06/1990, 24 y.o.   MRN: [id number]  HPI Weight Loss pt would like to discuss going on a weight loss med. she has been walking and watching her portions. She doesn't want to haver surgery. Doesn't drink soda.  Does drink one cup of juice at night  She has taken her snacks away from her desk. She hasn't lost any weight.  She said she initially lost 4 pounds but then gained 7 back. She started this new routine a few weeks ago. Her mother recently had weight loss surgery and has lost 90 pounds and is doing well but she's not at the point where she wants to have weight loss surgery.   Review of Systems     Objective:   Physical Exam  Constitutional: She is oriented to person, place, and time. She appears well-developed and well-nourished.  HENT:  Head: Normocephalic and atraumatic.  Cardiovascular: Normal rate, regular rhythm and normal heart sounds.   Pulmonary/Chest: Effort normal and breath sounds normal.  Neurological: She is alert and oriented to person, place, and time.  Skin: Skin is warm and dry.  Psychiatric: She has a normal mood and affect. Her behavior is normal.          Assessment & Plan:  Abnormal weight gain/BMI 55/morbid obesity-discussed different options and reviewed the current medications on the market. Also discussed the importance of setting in caloric intake goals for herself. Recommended the smart phone application called my fitness pal. Also could consider referral to comprehensive weight loss center. She prefers to start with phentermine after much discussion. She will check into coverage with her insurance as far as other medications are concerned. Discussed potential side effects. She stopped the medication immediately if she notices any chest pain shortness of breath or palpitations. Follow-up in one month for nurse blood pressure and weight check.  Time spent 20 min, > 50%spent counseling about  weight loss medications and strategies.

## 2014-10-15 NOTE — Patient Instructions (Signed)
My fitness Pal

## 2014-11-08 ENCOUNTER — Ambulatory Visit (INDEPENDENT_AMBULATORY_CARE_PROVIDER_SITE_OTHER): Payer: BLUE CROSS/BLUE SHIELD | Admitting: Family Medicine

## 2014-11-08 ENCOUNTER — Encounter: Payer: Self-pay | Admitting: Family Medicine

## 2014-11-08 VITALS — BP 133/83 | HR 109 | Wt 328.0 lb

## 2014-11-08 DIAGNOSIS — L298 Other pruritus: Secondary | ICD-10-CM | POA: Diagnosis not present

## 2014-11-08 DIAGNOSIS — N898 Other specified noninflammatory disorders of vagina: Secondary | ICD-10-CM

## 2014-11-08 MED ORDER — LIDOCAINE 5 % EX OINT: 1.0000 "application " | TOPICAL_OINTMENT | CUTANEOUS | Status: DC | PRN

## 2014-11-08 MED ORDER — METRONIDAZOLE 500 MG PO TABS: ORAL_TABLET | ORAL | Status: AC

## 2014-11-08 NOTE — Progress Notes (Signed)
CC: [name] is a 24 y.o. female is here for vaginal odor and discharge   Subjective: HPI:  Complaint of 3-4 days of foul odor in the vaginal area described as a metallic fishy smell. It's also accompanied by a thin mild white discharge. She's had some itching over the last 2 days localized to the labia. Interventions have included increasing the amount of showers during the day. No other interventions as of yet. She denies any other discharge or bleeding from the vagina. She denies dysuria, pelvic pain, nor any gastrointestinal complaints. She denies any visual changes other than the discharge in the genital area   Review Of Systems Outlined In HPI  Past Medical History  Diagnosis Date  . Obesity   . Diabetes mellitus without complication   . Hypertension     No past surgical history on file. Family History  Problem Relation Age of Onset  . Alcohol abuse Father   . Breast cancer      aunt  . Heart attack      grandfather  . Depression      aunt  . Diabetes      grandmother  . Hypertension Mother     History   Social History  . Marital Status: Single    Spouse Name: N/A  . Number of Children: N/A  . Years of Education: HS   Occupational History  .      [employer]   Social History Main Topics  . Smoking status: Never Smoker   . Smokeless tobacco: Never Used  . Alcohol Use: No  . Drug Use: No  . Sexual Activity: Yes    Birth Control/ Protection: IUD   Other Topics Concern  . Not on file   Social History Narrative   Lives with mother , brother, stepfather.  2 caffeinated drinks per day.  Mom is Dietrich [employer].      Objective: BP 133/83 mmHg  Pulse 109  Wt 328 lb (148.78 kg) Vital signs reviewed. General: Alert and Oriented, No Acute Distress HEENT: Pupils equal, round, reactive to light. Conjunctivae clear.  External ears unremarkable.  Moist mucous membranes. Lungs: Clear and comfortable work of breathing, speaking in full sentences without  accessory muscle use. Cardiac: Regular rate and rhythm.  Neuro: CN II-XII grossly intact, gait normal. Extremities: No peripheral edema.  Strong peripheral pulses.  Mental Status: No depression, anxiety, nor agitation. Logical though process. Skin: Warm and dry.  Assessment & Plan: Elsie SaasSequoia was seen today for vaginal odor and discharge.  Diagnoses and all orders for this visit:  Vaginal discharge Orders: -     WET PREP FOR TRICH, YEAST, CLUE -     metroNIDAZOLE (FLAGYL) 500 MG tablet; One tab every 12 hours for seven days. -     lidocaine (XYLOCAINE) 5 % ointment; Apply 1 application topically as needed.  Vaginal itching Orders: -     metroNIDAZOLE (FLAGYL) 500 MG tablet; One tab every 12 hours for seven days. -     lidocaine (XYLOCAINE) 5 % ointment; Apply 1 application topically as needed.   Based on historyShe is most likely suffering from bacterial vaginosis therefore start metronidazole. She give us a self swab wet prep but I will send the lab to make sure there is no trichomonas nor signs of yeast. Topical lidocaine to help with itching, avoiding corticosteroid  given that she might have a yeast infection   Return if symptoms worsen or fail to improve.

## 2014-11-09 LAB — WET PREP FOR TRICH, YEAST, CLUE
Trich, Wet Prep: NONE SEEN
YEAST WET PREP: NONE SEEN

## 2014-11-14 ENCOUNTER — Ambulatory Visit: Payer: BLUE CROSS/BLUE SHIELD | Admitting: Family Medicine

## 2015-02-05 ENCOUNTER — Encounter: Payer: Self-pay | Admitting: Sports Medicine

## 2015-02-05 ENCOUNTER — Ambulatory Visit (INDEPENDENT_AMBULATORY_CARE_PROVIDER_SITE_OTHER): Payer: BLUE CROSS/BLUE SHIELD | Admitting: Sports Medicine

## 2015-02-05 VITALS — BP 137/87 | HR 104 | Wt 307.0 lb

## 2015-02-05 DIAGNOSIS — L988 Other specified disorders of the skin and subcutaneous tissue: Secondary | ICD-10-CM

## 2015-02-05 DIAGNOSIS — L989 Disorder of the skin and subcutaneous tissue, unspecified: Secondary | ICD-10-CM | POA: Insufficient documentation

## 2015-02-05 MED ORDER — DOXYCYCLINE HYCLATE 100 MG PO TABS: 100.0000 mg | ORAL_TABLET | Freq: Two times a day (BID) | ORAL | Status: AC

## 2015-02-05 NOTE — Progress Notes (Signed)
   Subjective:    I'm seeing this patient as a consultation for:  Dr. Nani Gasser  CC: Axillary wound care  HPI: This is a pleasant 24 year old female, for the past several days she's had a worsening wound in her right axilla, initially she describes it as a mass, this drained after some time, and now simply represents a chronically open wound. It is no longer draining, it does not smell, however it is very painful. Symptoms are severe, persistent.  Past medical history, Surgical history, Family history not pertinant except as noted below, Social history, Allergies, and medications have been entered into the medical record, reviewed, and no changes needed.   Review of Systems: No headache, visual changes, nausea, vomiting, diarrhea, constipation, dizziness, abdominal pain, skin rash, fevers, chills, night sweats, weight loss, swollen lymph nodes, body aches, joint swelling, muscle aches, chest pain, shortness of breath, mood changes, visual or auditory hallucinations.   Objective:   General: Well Developed, well nourished, and in no acute distress.  Neuro/Psych: Alert and oriented x3, extra-ocular muscles intact, able to move all 4 extremities, sensation grossly intact. Skin: Warm and dry, no rashes noted.  Respiratory: Not using accessory muscles, speaking in full sentences, trachea midline.  Cardiovascular: Pulses palpable, no extremity edema. Abdomen: Does not appear distended. Right axilla: There is a circular, open wound without any signs of bacterial superinfection or granulation. There is no purulence, no erythema, no malodorous discharge.  Laceration repair: Right axilla open wound Indication: bleeding Location: Right axilla Size: 1 inch Anesthesia: 1%lidocaine with epi, good effect Wound explored, irrigated, FB removed (if present) Type of suture material: three 4-0 proline sutures placed Number of sutures: 3 Tolerated well Routine postprocedure instructions d/w pt-  keep area clean and bandaged, follow up if concerns/spreading erythema/pain.   Follow up for for suture removal.  Impression and Recommendations:   This case required medical decision making of moderate complexity.

## 2015-02-05 NOTE — Assessment & Plan Note (Signed)
No signs of bacterial superinfection. The skin defect was closed with simple interrupted sutures, and I am going to use doxycycline for antibiotic prophylaxis. Return to see me in one week for suture removal.

## 2015-02-12 ENCOUNTER — Encounter: Payer: Self-pay | Admitting: Sports Medicine

## 2015-02-12 ENCOUNTER — Ambulatory Visit (INDEPENDENT_AMBULATORY_CARE_PROVIDER_SITE_OTHER): Payer: BLUE CROSS/BLUE SHIELD | Admitting: Sports Medicine

## 2015-02-12 VITALS — BP 150/69 | HR 93 | Ht 65.0 in | Wt 304.0 lb

## 2015-02-12 DIAGNOSIS — L989 Disorder of the skin and subcutaneous tissue, unspecified: Secondary | ICD-10-CM

## 2015-02-12 DIAGNOSIS — L988 Other specified disorders of the skin and subcutaneous tissue: Secondary | ICD-10-CM

## 2015-02-12 NOTE — Progress Notes (Signed)
  Subjective:    CC: follow-up  HPI: [name] returns, we closed a chronically open skin wound with sutures at the last visit, she returns here for follow-up.  Past medical history, Surgical history, Family history not pertinant except as noted below, Social history, Allergies, and medications have been entered into the medical record, reviewed, and no changes needed.   Review of Systems: No fevers, chills, night sweats, weight loss, chest pain, or shortness of breath.   Objective:    General: Well Developed, well nourished, and in no acute distress.  Neuro: Alert and oriented x3, extra-ocular muscles intact, sensation grossly intact.  HEENT: Normocephalic, atraumatic, pupils equal round reactive to light, neck supple, no masses, no lymphadenopathy, thyroid nonpalpable.  Skin: Warm and dry, no rashes.  Noted lesion with sutures pulled through, all 3 simple interrupted Prolene sutures were removed and we applied Dermabond to close the wound. Cardiac: Regular rate and rhythm, no murmurs rubs or gallops, no lower extremity edema.  Respiratory: Clear to auscultation bilaterally. Not using accessory muscles, speaking in full sentences.  Impression and Recommendations:

## 2015-02-12 NOTE — Assessment & Plan Note (Signed)
Sutures did pull-through although it does look better than before. Still taking antibiotics. Added Dermabond, and she will return as needed.

## 2015-03-26 ENCOUNTER — Ambulatory Visit (INDEPENDENT_AMBULATORY_CARE_PROVIDER_SITE_OTHER): Payer: BLUE CROSS/BLUE SHIELD | Admitting: Osteopathic Medicine

## 2015-03-26 ENCOUNTER — Encounter: Payer: Self-pay | Admitting: Osteopathic Medicine

## 2015-03-26 VITALS — BP 134/81 | HR 109 | Temp 98.7°F | Ht 65.0 in | Wt 296.0 lb

## 2015-03-26 DIAGNOSIS — J069 Acute upper respiratory infection, unspecified: Secondary | ICD-10-CM | POA: Diagnosis not present

## 2015-03-26 DIAGNOSIS — R110 Nausea: Secondary | ICD-10-CM

## 2015-03-26 DIAGNOSIS — A084 Viral intestinal infection, unspecified: Secondary | ICD-10-CM

## 2015-03-26 DIAGNOSIS — B9789 Other viral agents as the cause of diseases classified elsewhere: Secondary | ICD-10-CM

## 2015-03-26 LAB — POCT INFLUENZA A/B
Influenza A, POC: NEGATIVE
Influenza B, POC: NEGATIVE

## 2015-03-26 MED ORDER — ONDANSETRON 8 MG PO TBDP: 8.0000 mg | ORAL_TABLET | Freq: Three times a day (TID) | ORAL | Status: DC | PRN

## 2015-03-26 MED ORDER — BENZONATATE 200 MG PO CAPS: 200.0000 mg | ORAL_CAPSULE | Freq: Three times a day (TID) | ORAL | Status: DC | PRN

## 2015-03-26 MED ORDER — METHYLPREDNISOLONE 4 MG PO TBPK: ORAL_TABLET | ORAL | Status: DC

## 2015-03-26 NOTE — Patient Instructions (Signed)
Viral Infections °A viral infection can be caused by different types of viruses. Most viral infections are not serious and resolve on their own. However, some infections may cause severe symptoms and may lead to further complications. °SYMPTOMS °Viruses can frequently cause: °· Minor sore throat. °· Aches and pains. °· Headaches. °· Runny nose. °· Different types of rashes. °· Watery eyes. °· Tiredness. °· Cough. °· Loss of appetite. °· Gastrointestinal infections, resulting in nausea, vomiting, and diarrhea. °These symptoms do not respond to antibiotics because the infection is not caused by bacteria. However, you might catch a bacterial infection following the viral infection. This is sometimes called a "superinfection." Symptoms of such a bacterial infection may include: °· Worsening sore throat with pus and difficulty swallowing. °· Swollen neck glands. °· Chills and a high or persistent fever. °· Severe headache. °· Tenderness over the sinuses. °· Persistent overall ill feeling (malaise), muscle aches, and tiredness (fatigue). °· Persistent cough. °· Yellow, green, or brown mucus production with coughing. °HOME CARE INSTRUCTIONS  °· Only take over-the-counter or prescription medicines for pain, discomfort, diarrhea, or fever as directed by your caregiver. °· Drink enough water and fluids to keep your urine clear or pale yellow. Sports drinks can provide valuable electrolytes, sugars, and hydration. °· Get plenty of rest and maintain proper nutrition. Soups and broths with crackers or rice are fine. °SEEK IMMEDIATE MEDICAL CARE IF:  °· You have severe headaches, shortness of breath, chest pain, neck pain, or an unusual rash. °· You have uncontrolled vomiting, diarrhea, or you are unable to keep down fluids. °· You or your child has an oral temperature above 102° F (38.9° C), not controlled by medicine. °· Your baby is older than 3 months with a rectal temperature of 102° F (38.9° C) or higher. °· Your baby is 3  months old or younger with a rectal temperature of 100.4° F (38° C) or higher. °MAKE SURE YOU:  °· Understand these instructions. °· Will watch your condition. °· Will get help right away if you are not doing well or get worse. °  °This information is not intended to replace advice given to you by your health care provider. Make sure you discuss any questions you have with your health care provider. °  °Document Released: 01/21/2005 Document Revised: 07/06/2011 Document Reviewed: 09/19/2014 °Elsevier Interactive Patient Education ©2016 Elsevier Inc[employer] ° °

## 2015-03-26 NOTE — Progress Notes (Signed)
HPI: [name] is a 24 y.o. female who [facility]  today for chief complaint of:  Chief Complaint  Patient presents with  . Generalized Body Aches    ALL OVER   . Quality:  Patient reports generalized  Fatigue, body aches . Duration: 2 days . Timing: constant . Modifying factors: OTC meds not helping . Assoc signs/symptoms: sinus pressure/drainage, loose stool 4 over te past 36 hours or so   Past medical, social and family history reviewed: Past Medical History  Diagnosis Date  . Obesity   . Diabetes mellitus without complication (HCC)   . Hypertension    No past surgical history on file. Social History  Substance Use Topics  . Smoking status: Never Smoker   . Smokeless tobacco: Never Used  . Alcohol Use: No   Family History  Problem Relation Age of Onset  . Alcohol abuse Father   . Breast cancer      aunt  . Heart attack      grandfather  . Depression      aunt  . Diabetes      grandmother  . Hypertension Mother     Current Outpatient Prescriptions  Medication Sig Dispense Refill  . triamcinolone ointment (KENALOG) 0.5 % APPLY TO AFFECTED AREAS TWICE DAILY - AVOID EYES AND FACE 30 g 4  . [DISCONTINUED] gabapentin (NEURONTIN) 600 MG tablet One half tab at bedtime for a week then twice a day for a week then 3 times a day 60 tablet 0  . [DISCONTINUED] traZODone (DESYREL) 50 MG tablet Take 0.5-2 tablets (25-100 mg total) by mouth at bedtime as needed for sleep. 60 tablet 3   No current facility-administered medications for this visit.   Allergies  Allergen Reactions  . Shellfish Allergy       Review of Systems: CONSTITUTIONAL:  subjective fever, no chills, No  unintentional weight changes HEAD/EYES/EARS/NOSE/THROAT: No headache, no vision change, no hearing change, Yes  sore throat CARDIAC: No chest pain, no pressure/palpitations, no orthopnea RESPIRATORY: mild cough, No  shortness of  breath/wheeze GASTROINTESTINAL: (+) nausea, no vomiting, no abdominal pain, no blood in stool, (+) diarrhea, no constipation MUSCULOSKELETAL: Yes  myalgia/arthralgia SKIN: No rash/wounds/concerning lesions   Exam:  BP 134/81 mmHg  Pulse 109  Temp(Src) 98.7 F (37.1 C) (Oral)  Ht 5\' 5"  (1.651 m)  Wt 296 lb (134.265 kg)  BMI 49.26 kg/m2 Constitutional: VSS, see above. General Appearance: alert, well-developed, well-nourished, NAD Eyes: Normal lids and conjunctive, non-icteric sclera, PERRLA Ears, Nose, Mouth, Throat: Normal external inspection ears/nares/mouth/lips/gums, TM normal bilaterally, MMM, posterior pharynx No  erythema No  exudate Neck: No masses, trachea midline. No thyroid enlargement/tenderness/mass appreciated. No lymphadenopathy Respiratory: Normal respiratory effort. no wheeze, no rhonchi, no rales Cardiovascular: S1/S2 normal, no murmur, no rub/gallop auscultated. RRR.  Gastrointestinal: Nontender, no masses. N    Flu swab negative  ASSESSMENT/PLAN: Symptoms consistent with viral respiratory infection, possible viral gastroenteritis. Patient education on supportive care, given prescriptions for treatment of cough, nausea. Unlikely flu. Follow-up if no improvement  Viral gastroenteritis  Nausea without vomiting - Plan: ondansetron (ZOFRAN-ODT) 8 MG disintegrating tablet, POCT Influenza A/B  Viral URI with cough - Plan: benzonatate (TESSALON) 200 MG capsule, methylPREDNISolone (MEDROL DOSEPAK) 4 MG TBPK tablet   Return if symptoms worsen or fail to improve.

## 2015-06-03 ENCOUNTER — Encounter: Payer: Self-pay | Admitting: Family Medicine

## 2015-06-03 ENCOUNTER — Ambulatory Visit (INDEPENDENT_AMBULATORY_CARE_PROVIDER_SITE_OTHER): Payer: BLUE CROSS/BLUE SHIELD | Admitting: Family Medicine

## 2015-06-03 VITALS — BP 112/61 | HR 79 | Ht 65.0 in | Wt 302.0 lb

## 2015-06-03 DIAGNOSIS — Z6841 Body Mass Index (BMI) 40.0 and over, adult: Secondary | ICD-10-CM

## 2015-06-03 DIAGNOSIS — R635 Abnormal weight gain: Secondary | ICD-10-CM

## 2015-06-03 DIAGNOSIS — N920 Excessive and frequent menstruation with regular cycle: Secondary | ICD-10-CM

## 2015-06-03 MED ORDER — NORETHIN-ETH ESTRAD TRIPHASIC 0.5/0.75/1-35 MG-MCG PO TABS: 1.0000 | ORAL_TABLET | Freq: Every day | ORAL | Status: DC

## 2015-06-03 MED ORDER — PHENTERMINE HCL 37.5 MG PO CAPS: 37.5000 mg | ORAL_CAPSULE | ORAL | Status: DC

## 2015-06-03 MED ORDER — DICLOFENAC SODIUM 75 MG PO TBEC: 75.0000 mg | DELAYED_RELEASE_TABLET | Freq: Two times a day (BID) | ORAL | Status: DC | PRN

## 2015-06-03 MED ORDER — KETOROLAC TROMETHAMINE 60 MG/2ML IM SOLN
60.0000 mg | Freq: Once | INTRAMUSCULAR | Status: AC
  Administered 2015-06-03: 60 mg via INTRAMUSCULAR

## 2015-06-03 MED ORDER — MEDROXYPROGESTERONE ACETATE 10 MG PO TABS: 10.0000 mg | ORAL_TABLET | Freq: Every day | ORAL | Status: DC

## 2015-06-03 NOTE — Progress Notes (Signed)
   Subjective:    Patient ID: [name], female    DOB: 23-Dec-1990, 25 y.o.   MRN: [id number]  HPI C/o of painful and heavy menstrual cycles.  pt reports that she has pain in her lower abdomen that is shooting. bleeding is heavy she is changing her pads every 2 hours and has been passing clots. voltaren usually helps but did not help with her pain this time. Using a heating pad and even took vicodin and didn't really help.  She is currently on her period. Started on Friday x 4 days.  Has been extremely heavy since Friday .  Periods are regular and usually last 6 days.  She started her period when she was 9.    Abnormal weight gain-she would also like to consider restarting the phentermine. She did really well on it while she was taking it. She has gained some weight back and wants to try to get back on track. Her mom recently had weight loss surgery.  Review of Systems     Objective:   Physical Exam  Constitutional: She is oriented to person, place, and time. She appears well-developed and well-nourished.  HENT:  Head: Normocephalic and atraumatic.  Eyes: Conjunctivae and EOM are normal.  Cardiovascular: Normal rate.   Pulmonary/Chest: Effort normal.  Neurological: She is alert and oriented to person, place, and time.  Skin: Skin is dry. No pallor.  Psychiatric: She has a normal mood and affect. Her behavior is normal.  Vitals reviewed.      Assessment & Plan:  Menorrhagia - we discussed for short-term we'll try to get the bleeding to stop. Will give her 10 days of Provera 10 mg daily. If the bleeding doesn't stop in the next 2 days and she can call the office back and we can adjust her dose. Explained that after the 10 day she would have some withdrawal bleeding and basically have another. But hopefully lighter. Discussed the long-term treatment by restarting birth control. She was on it previously but wanted to give her body or break. But How this can shorten the. And reduce the  heaviness of flow. We also discussed the importance of weight loss. With her BMI greater than 40 this puts her at high risk of also having anovulatory bleeding which can then make her anemic. We discussed getting her weight under control and the family help with her cycles as well. She's not currently trying to get pregnant. Did encourage her to schedule for her Pap smear as she is overdue and we can do STD testing for gonorrhea and chlamydia at that time. Also schedule her for pelvic ultrasound to further workup possible fibroids etc.  Abnormal weight gain/BMI 50 - discussed restarting phentermine. Reminded her about potential side effects. She will need follow-up in 4 weeks for blood pressure and weight check. Make sure that she is starting an exercise regimen as well as a diet/weight loss program.  She is due for pap smear.

## 2015-06-03 NOTE — Patient Instructions (Addendum)
Take the progesterone for 10 days. Then you will have another period. Then after 5 days then can start the birth control.

## 2015-06-12 ENCOUNTER — Ambulatory Visit (INDEPENDENT_AMBULATORY_CARE_PROVIDER_SITE_OTHER): Payer: BLUE CROSS/BLUE SHIELD

## 2015-06-12 DIAGNOSIS — N920 Excessive and frequent menstruation with regular cycle: Secondary | ICD-10-CM | POA: Diagnosis not present

## 2015-07-15 ENCOUNTER — Encounter: Payer: BLUE CROSS/BLUE SHIELD | Admitting: Family Medicine

## 2015-08-01 ENCOUNTER — Ambulatory Visit (INDEPENDENT_AMBULATORY_CARE_PROVIDER_SITE_OTHER): Payer: BLUE CROSS/BLUE SHIELD | Admitting: Family Medicine

## 2015-08-01 DIAGNOSIS — E669 Obesity, unspecified: Secondary | ICD-10-CM

## 2015-08-02 NOTE — Progress Notes (Signed)
Patient has been deemed a "no-show" for today's scheduled appointment.  Based on chief complaint and my chart review: -No follow-up necessary.   If necessary this was communicated to the patient via phone or mail on: 8:39 AM 08/02/2015

## 2015-10-28 ENCOUNTER — Other Ambulatory Visit: Payer: Self-pay | Admitting: *Deleted

## 2015-10-28 MED ORDER — TRIAMCINOLONE ACETONIDE 0.1 % EX CREA: TOPICAL_CREAM | Freq: Two times a day (BID) | CUTANEOUS | Status: DC

## 2015-10-31 ENCOUNTER — Other Ambulatory Visit: Payer: Self-pay | Admitting: *Deleted

## 2015-10-31 MED ORDER — TRIAMCINOLONE ACETONIDE 0.5 % EX OINT: TOPICAL_OINTMENT | CUTANEOUS | Status: DC

## 2018-05-06 ENCOUNTER — Telehealth: Payer: Self-pay | Admitting: Family Medicine

## 2018-05-06 NOTE — Telephone Encounter (Signed)
Thank you :)

## 2018-05-06 NOTE — Telephone Encounter (Signed)
Spoke to Patient, she no longer is a patient here. She is now living in [place], [place] - PCP removed.

## 2018-05-06 NOTE — Telephone Encounter (Signed)
Please call patient: We have not seen her in a couple of years and we just wanted to see if she is still coming here or has found a new PCP.  I just wanted to also remind her that she is due for a Pap smear.  Not sure if she follows with an OB/GYN or not.

## 2019-05-25 DIAGNOSIS — E559 Vitamin D deficiency, unspecified: Secondary | ICD-10-CM | POA: Insufficient documentation

## 2019-05-25 LAB — COMPREHENSIVE METABOLIC PANEL
Albumin: 4.2 (ref 3.5–5.0)
Calcium: 10 (ref 8.7–10.7)
GFR calc Af Amer: 139
GFR calc non Af Amer: 121
Globulin: 2.9

## 2019-05-25 LAB — BASIC METABOLIC PANEL
CO2: 25 — AB (ref 13–22)
Chloride: 102 (ref 99–108)
Creatinine: 0.7 (ref <=1.1)
Glucose: 76
Potassium: 4.9 (ref 3.4–5.3)
Sodium: 141 (ref 137–147)

## 2019-05-25 LAB — CBC AND DIFFERENTIAL
HCT: 40 (ref 36–46)
Hemoglobin: 12.9 (ref 12.0–16.0)
Neutrophils Absolute: 8
Platelets: 479 — AB (ref 150–399)
WBC: 12.4

## 2019-05-25 LAB — LIPID PANEL
Cholesterol: 154 (ref 0–200)
HDL: 57 (ref 35–70)
LDL Cholesterol: 84
LDl/HDL Ratio: 13
Triglycerides: 62 (ref 40–160)

## 2019-05-25 LAB — VITAMIN D 25 HYDROXY (VIT D DEFICIENCY, FRACTURES): Vit D, 25-Hydroxy: 10.4

## 2019-05-25 LAB — VITAMIN B12: Vitamin B-12: 686

## 2019-05-25 LAB — HEPATIC FUNCTION PANEL
ALT: 10 (ref 7–35)
AST: 13 (ref 13–35)
Alkaline Phosphatase: 136 — AB (ref 25–125)
Bilirubin, Total: 0.6

## 2019-05-25 LAB — HEMOGLOBIN A1C: Hemoglobin A1C: 5.9

## 2019-05-25 LAB — CBC: RBC: 5.03 (ref 3.87–5.11)

## 2019-05-25 LAB — TSH: TSH: 0.9 (ref <=5.90)

## 2019-05-26 DIAGNOSIS — R7303 Prediabetes: Secondary | ICD-10-CM | POA: Insufficient documentation

## 2019-06-22 LAB — CBC: RBC: 5.02 (ref 3.87–5.11)

## 2019-06-22 LAB — CBC AND DIFFERENTIAL
HCT: 39 (ref 36–46)
Hemoglobin: 12.9 (ref 12.0–16.0)
Neutrophils Absolute: 9
Platelets: 544 — AB (ref 150–399)
WBC: 13.7

## 2019-07-17 ENCOUNTER — Telehealth: Payer: Self-pay | Admitting: General Practice

## 2019-07-17 NOTE — Telephone Encounter (Signed)
Patient wanted to re-establish with you again. Moved back and wanted to see you and for a physical. Last office visit was 2017. Also she has having a knee issue that she wants addressed.

## 2019-07-17 NOTE — Telephone Encounter (Signed)
I would love to see her back . Just remind her with have to check with insurance to make sure they will pay for CPE and a problem visit on same day.

## 2019-07-18 NOTE — Telephone Encounter (Signed)
Patient scheduled.

## 2019-07-21 ENCOUNTER — Other Ambulatory Visit: Payer: Self-pay

## 2019-07-21 ENCOUNTER — Ambulatory Visit (INDEPENDENT_AMBULATORY_CARE_PROVIDER_SITE_OTHER): Payer: 59

## 2019-07-21 ENCOUNTER — Encounter: Payer: Self-pay | Admitting: Family Medicine

## 2019-07-21 ENCOUNTER — Ambulatory Visit (INDEPENDENT_AMBULATORY_CARE_PROVIDER_SITE_OTHER): Payer: 59 | Admitting: Family Medicine

## 2019-07-21 VITALS — BP 129/81 | HR 122 | Ht 65.0 in | Wt 316.0 lb

## 2019-07-21 DIAGNOSIS — M25562 Pain in left knee: Secondary | ICD-10-CM | POA: Insufficient documentation

## 2019-07-21 DIAGNOSIS — Z6841 Body Mass Index (BMI) 40.0 and over, adult: Secondary | ICD-10-CM | POA: Insufficient documentation

## 2019-07-21 DIAGNOSIS — L308 Other specified dermatitis: Secondary | ICD-10-CM

## 2019-07-21 NOTE — Assessment & Plan Note (Signed)
Unclear etiology.  Consider ligament injury versus cartilage tear and or even a loose body injury since the initial injury was a direct impact fall on the knee.  No twisting of the knee.  We will start with a plain film x-ray since been going on for over a month.  I am curious to see if her prednisone that she will be taking for eczema will actually help with her knee pain.  Recommend elevating and icing as needed.  Will call with results once available.

## 2019-07-21 NOTE — Assessment & Plan Note (Signed)
Started on a prednisone taper today they actually went ahead and did a biopsy and she should hopefully hear back about that next week.

## 2019-07-21 NOTE — Assessment & Plan Note (Signed)
She is working with [employer].  She is currently on a low-dose phentermine.  She did start to take Saxenda a while back but never actually completed the taper.

## 2019-07-21 NOTE — Progress Notes (Signed)
Established Patient Office Visit  Subjective:  Patient ID: [name], female    DOB: May 22, 1990  Age: 28 y.o. MRN: [id number]  CC:  Chief Complaint  Patient presents with  . Knee Pain    L knee    HPI [name] presents for Left knee pain.  She feel and injured the knee about 3 months ago but didn't really start getting pain until about 1 months ago.  The initial fall she was going down some stairs and fell forward and had a direct impact on the knee.  And then about a month ago started noticing that it was painful when she would try to completely straighten the knee.  She said she did not notice any swelling after the initial injury and has not really noticed any swelling since the pain started.  She has not really tried heat or ice.  She does take Aleve and ibuprofen around her menstrual cycle but has not tried it specifically for her knee.  She is actually just started on a 3 to 4-week steroid taper for her eczema and she started it this morning.  She notices that knee pain is worse if she kneels or with walking.  She says it usually takes a couple minutes into her walk before she starts to feel any discomfort.  And she recently started a jump rope challenge with her mom.  Also complains of getting a lot of tightness in her posterior calf on that left leg with walking that has also started in the last month.  Past Medical History:  Diagnosis Date  . Diabetes mellitus without complication (HCC)   . Hypertension   . Obesity     No past surgical history on file.  Family History  Problem Relation Age of Onset  . Alcohol abuse Father   . Breast cancer Unknown        aunt  . Heart attack Unknown        grandfather  . Depression Unknown        aunt  . Diabetes Unknown        grandmother  . Hypertension Mother     Social History   Socioeconomic History  . Marital status: Single    Spouse name: Not on file  . Number of children: Not on file  . Years of education:  HS  . Highest education level: Not on file  Occupational History  . Occupation: Med-Tech    Comment: [employer]   Tobacco Use  . Smoking status: Never Smoker  . Smokeless tobacco: Never Used  Substance and Sexual Activity  . Alcohol use: No  . Drug use: No  . Sexual activity: Yes    Birth control/protection: I.U.D.  Other Topics Concern  . Not on file  Social History Narrative   Lives with mother , brother, stepfather.  2 caffeinated drinks per day.  Mom is Dietrich Pates. 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    Social Determinants of Health   Financial Resource Strain:   . Difficulty of Paying Living Expenses:   Food Insecurity:   . Worried About Programme researcher, broadcasting/film/video in the Last Year:   . Barista in the Last Year:   Transportation Needs:   . Freight forwarder (Medical):   Marland Kitchen Lack of Transportation (Non-Medical):   Physical Activity:   . Days of Exercise per Week:   . Minutes of Exercise per Session:   Stress:   . Feeling of Stress :  Social Connections:   . Frequency of Communication with Friends and Family:   . Frequency of Social Gatherings with Friends and Family:   . Attends Religious Services:   . Active Member of Clubs or Organizations:   . Attends Banker Meetings:   Marland Kitchen Marital Status:   Intimate Partner Violence:   . Fear of Current or Ex-Partner:   . Emotionally Abused:   Marland Kitchen Physically Abused:   . Sexually Abused:     Outpatient Medications Prior to Visit  Medication Sig Dispense Refill  . ergocalciferol (VITAMIN D2) 1.25 MG (50000 UT) capsule Take 1 capsule by mouth once a week.    . Phentermine HCl (LOMAIRA) 8 MG TABS Take 1 tablet by mouth.    . predniSONE (DELTASONE) 10 MG tablet Take      6 tabs x5 days, 4 tabs x5 days, 2 tabs x5 days, 1 tab x5 days    . triamcinolone ointment (KENALOG) 0.1 % Apply      to itchy area twice daily x 2-3 weeks or until clear.    . diclofenac (VOLTAREN) 75 MG EC tablet Take 1 tablet (75 mg  total) by mouth 2 (two) times daily as needed. 60 tablet 3  . medroxyPROGESTERone (PROVERA) 10 MG tablet Take 1 tablet (10 mg total) by mouth daily. 10 tablet 1  . norethindrone-ethinyl estradiol (ORTHO-NOVUM 7/7/7, 28,) 0.5/0.75/1-35 MG-MCG tablet Take 1 tablet by mouth daily. 1 Package 11  . phentermine 37.5 MG capsule Take 1 capsule (37.5 mg total) by mouth every morning. 30 capsule 0  . triamcinolone ointment (KENALOG) 0.5 % APPLY TO AFFECTED AREAS TWICE DAILY - AVOID EYES AND FACE 30 g prn   No facility-administered medications prior to visit.    Allergies  Allergen Reactions  . Shellfish Allergy     ROS Review of Systems    Objective:    Physical Exam  Constitutional: She is oriented to person, place, and time. She appears well-developed and well-nourished.  HENT:  Head: Normocephalic and atraumatic.  Eyes: Conjunctivae and EOM are normal.  Cardiovascular: Normal rate.  Pulmonary/Chest: Effort normal.  Musculoskeletal:     Comments: Significant swelling of the left knee.  Normal flexion and slightly decreased extension compared to her right.  She actually has hypermobile joints.  No increased laxity or pain with movement of the patella.  Nontender along the patellar tendon or joint lines.  Negative McMurray's.  She did have just a little bit more of an endpoint with anterior drawer test.  And she had significant pain laterally with valgus stress on the knee.  Strength at the knee and ankles is 5 out of 5 bilaterally.  Neurological: She is alert and oriented to person, place, and time.  Skin: Skin is dry. No pallor.  Psychiatric: She has a normal mood and affect. Her behavior is normal.  Vitals reviewed.   BP 129/81   Pulse (!) 122   Ht 5\' 5"  (1.651 m)   Wt (!) 316 lb (143.3 kg)   LMP 06/08/2019 (Approximate)   SpO2 99%   BMI 52.59 kg/m  Wt Readings from Last 3 Encounters:  07/21/19 (!) 316 lb (143.3 kg)  06/03/15 (!) 302 lb (137 kg)  03/26/15 296 lb (134.3 kg)      There are no preventive care reminders to display for this patient.  There are no preventive care reminders to display for this patient.  Lab Results  Component Value Date   TSH 0.819 01/17/2014   Lab  Results  Component Value Date   WBC 10.4 01/17/2014   HGB 12.4 01/17/2014   HCT 38.6 01/17/2014   MCV 78.1 01/17/2014   PLT 423 (H) 01/17/2014   Lab Results  Component Value Date   NA 140 01/17/2014   K 4.4 01/17/2014   CO2 25 01/17/2014   GLUCOSE 82 01/17/2014   BUN 10 01/17/2014   CREATININE 0.65 01/17/2014   BILITOT 0.8 01/17/2014   ALKPHOS 87 01/17/2014   AST 14 01/17/2014   ALT 12 01/17/2014   PROT 7.6 01/17/2014   ALBUMIN 3.9 01/17/2014   CALCIUM 9.8 01/17/2014   No results found for: CHOL No results found for: HDL No results found for: LDLCALC No results found for: TRIG No results found for: CHOLHDL Lab Results  Component Value Date   HGBA1C 6.1 (H) 01/17/2014      Assessment & Plan:   Problem List Items Addressed This Visit      Musculoskeletal and Integument   Eczema    Started on a prednisone taper today they actually went ahead and did a biopsy and she should hopefully hear back about that next week.        Other   BMI 50.0-59.9, adult [facility])    She is working with [employer] bariatric solutions.  She is currently on a low-dose phentermine.  She did start to take Rew a while back but never actually completed the taper.      Relevant Medications   Phentermine HCl (LOMAIRA) 8 MG TABS   Acute pain of left knee - Primary    Unclear etiology.  Consider ligament injury versus cartilage tear and or even a loose body injury since the initial injury was a direct impact fall on the knee.  No twisting of the knee.  We will start with a plain film x-ray since been going on for over a month.  I am curious to see if her prednisone that she will be taking for eczema will actually help with her knee pain.  Recommend elevating and icing as needed.  Will call  with results once available.      Relevant Orders   DG Knee Complete 4 Views Left      No orders of the defined types were placed in this encounter.   Follow-up: Return if symptoms worsen or fail to improve.    Beatrice Lecher, MD

## 2019-07-21 NOTE — Patient Instructions (Signed)
If your knee starts to become painful I do want you to try some ice and trying to elevate it.

## 2019-08-22 ENCOUNTER — Ambulatory Visit: Payer: 59 | Admitting: Family Medicine

## 2019-08-22 NOTE — Progress Notes (Deleted)
Established Patient Office Visit  Subjective:  Patient ID: [name], female    DOB: 29-Sep-1990  Age: 29 y.o. MRN: [id number]  CC: No chief complaint on file.   HPI [name] presents for ***  Past Medical History:  Diagnosis Date  . Diabetes mellitus without complication (HCC)   . Hypertension   . Obesity     No past surgical history on file.  Family History  Problem Relation Age of Onset  . Alcohol abuse Father   . Breast cancer Unknown        aunt  . Heart attack Unknown        grandfather  . Depression Unknown        aunt  . Diabetes Unknown        grandmother  . Hypertension Mother     Social History   Socioeconomic History  . Marital status: Single    Spouse name: Not on file  . Number of children: Not on file  . Years of education: HS  . Highest education level: Not on file  Occupational History  . Occupation: Med-Tech    Comment: [employer]   Tobacco Use  . Smoking status: Never Smoker  . Smokeless tobacco: Never Used  Substance and Sexual Activity  . Alcohol use: No  . Drug use: No  . Sexual activity: Yes    Birth control/protection: I.U.D.  Other Topics Concern  . Not on file  Social History Narrative   Lives with mother , brother, stepfather.  2 caffeinated drinks per day.  Mom is [name]. 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    Social Determinants of Health   Financial Resource Strain:   . Difficulty of Paying Living Expenses:   Food Insecurity:   . Worried About Programme researcher, broadcasting/film/video in the Last Year:   . Barista in the Last Year:   Transportation Needs:   . Freight forwarder (Medical):   Marland Kitchen Lack of Transportation (Non-Medical):   Physical Activity:   . Days of Exercise per Week:   . Minutes of Exercise per Session:   Stress:   . Feeling of Stress :   Social Connections:   . Frequency of Communication with Friends and Family:   . Frequency of Social Gatherings with Friends and Family:   .  Attends Religious Services:   . Active Member of Clubs or Organizations:   . Attends Banker Meetings:   Marland Kitchen Marital Status:   Intimate Partner Violence:   . Fear of Current or Ex-Partner:   . Emotionally Abused:   Marland Kitchen Physically Abused:   . Sexually Abused:     Outpatient Medications Prior to Visit  Medication Sig Dispense Refill  . Phentermine HCl (LOMAIRA) 8 MG TABS Take 1 tablet by mouth.    . predniSONE (DELTASONE) 10 MG tablet Take      6 tabs x5 days, 4 tabs x5 days, 2 tabs x5 days, 1 tab x5 days    . triamcinolone ointment (KENALOG) 0.1 % Apply      to itchy area twice daily x 2-3 weeks or until clear.     No facility-administered medications prior to visit.    Allergies  Allergen Reactions  . Shellfish Allergy     ROS Review of Systems    Objective:    Physical Exam  There were no vitals taken for this visit. Wt Readings from Last 3 Encounters:  07/21/19 (!) 316 lb (143.3 kg)  06/03/15 (!) 302 lb (137 kg)  03/26/15 296 lb (134.3 kg)     Health Maintenance Due  Topic Date Due  . COVID-19 Vaccine (1) Never done    There are no preventive care reminders to display for this patient.  Lab Results  Component Value Date   TSH 0.819 01/17/2014   Lab Results  Component Value Date   WBC 10.4 01/17/2014   HGB 12.4 01/17/2014   HCT 38.6 01/17/2014   MCV 78.1 01/17/2014   PLT 423 (H) 01/17/2014   Lab Results  Component Value Date   NA 140 01/17/2014   K 4.4 01/17/2014   CO2 25 01/17/2014   GLUCOSE 82 01/17/2014   BUN 10 01/17/2014   CREATININE 0.65 01/17/2014   BILITOT 0.8 01/17/2014   ALKPHOS 87 01/17/2014   AST 14 01/17/2014   ALT 12 01/17/2014   PROT 7.6 01/17/2014   ALBUMIN 3.9 01/17/2014   CALCIUM 9.8 01/17/2014   No results found for: CHOL No results found for: HDL No results found for: LDLCALC No results found for: TRIG No results found for: CHOLHDL Lab Results  Component Value Date   HGBA1C 6.1 (H) 01/17/2014       Assessment & Plan:   Problem List Items Addressed This Visit    None      No orders of the defined types were placed in this encounter.   Follow-up: No follow-ups on file.    Audrey Lecher, MD

## 2019-11-10 ENCOUNTER — Telehealth: Payer: Self-pay | Admitting: Family Medicine

## 2019-11-10 NOTE — Telephone Encounter (Signed)
Hi Biance,   Per our current records it looks like you are due for a Pap smear.  Do you see GYN.  One was your most recent.  Aced on our current records it looks like you are overdue.  Please would encourage you to schedule that either here in our office or with an OB/GYN.

## 2019-11-10 NOTE — Telephone Encounter (Signed)
Patient contacted, patient was advised she is due for a pap smear. Patient voiced her understanding and was transferred to front desk for scheduling.

## 2019-12-01 ENCOUNTER — Ambulatory Visit: Payer: 59 | Admitting: Family Medicine

## 2019-12-06 ENCOUNTER — Encounter: Payer: Self-pay | Admitting: Family Medicine

## 2019-12-06 LAB — FERRITIN: Ferritin: 52

## 2019-12-06 LAB — FOLATE
Folate: 4.9
Folate: 4.9

## 2019-12-12 ENCOUNTER — Other Ambulatory Visit (HOSPITAL_COMMUNITY)
Admission: RE | Admit: 2019-12-12 | Discharge: 2019-12-12 | Disposition: A | Discharge status: Home / Self Care | Payer: 59 | Source: Ambulatory Visit | Attending: Family Medicine | Admitting: Family Medicine

## 2019-12-12 ENCOUNTER — Encounter: Payer: Self-pay | Admitting: Family Medicine

## 2019-12-12 ENCOUNTER — Ambulatory Visit (INDEPENDENT_AMBULATORY_CARE_PROVIDER_SITE_OTHER): Payer: 59 | Admitting: Family Medicine

## 2019-12-12 ENCOUNTER — Other Ambulatory Visit: Payer: Self-pay

## 2019-12-12 VITALS — BP 129/69 | HR 108 | Ht 65.0 in | Wt 313.0 lb

## 2019-12-12 DIAGNOSIS — Z124 Encounter for screening for malignant neoplasm of cervix: Secondary | ICD-10-CM | POA: Insufficient documentation

## 2019-12-12 DIAGNOSIS — L0291 Cutaneous abscess, unspecified: Secondary | ICD-10-CM | POA: Diagnosis not present

## 2019-12-12 DIAGNOSIS — Z0000 Encounter for general adult medical examination without abnormal findings: Secondary | ICD-10-CM

## 2019-12-12 DIAGNOSIS — Z113 Encounter for screening for infections with a predominantly sexual mode of transmission: Secondary | ICD-10-CM

## 2019-12-12 MED ORDER — MUPIROCIN 2 % EX OINT: TOPICAL_OINTMENT | Freq: Two times a day (BID) | CUTANEOUS | 0 refills | Status: DC

## 2019-12-12 NOTE — Patient Instructions (Signed)
Preventive Care 21-28 Years Old, Female Preventive care refers to visits with your health care provider and lifestyle choices that can promote health and wellness. This includes:  A yearly physical exam. This may also be called an annual well check.  Regular dental visits and eye exams.  Immunizations.  Screening for certain conditions.  Healthy lifestyle choices, such as eating a healthy diet, getting regular exercise, not using drugs or products that contain nicotine and tobacco, and limiting alcohol use. What can I expect for my preventive care visit? Physical exam Your health care provider will check your:  Height and weight. This may be used to calculate body mass index (BMI), which tells if you are at a healthy weight.  Heart rate and blood pressure.  Skin for abnormal spots. Counseling Your health care provider may ask you questions about your:  Alcohol, tobacco, and drug use.  Emotional well-being.  Home and relationship well-being.  Sexual activity.  Eating habits.  Work and work environment.  Method of birth control.  Menstrual cycle.  Pregnancy history. What immunizations do I need?  Influenza (flu) vaccine  This is recommended every year. Tetanus, diphtheria, and pertussis (Tdap) vaccine  You may need a Td booster every 10 years. Varicella (chickenpox) vaccine  You may need this if you have not been vaccinated. Human papillomavirus (HPV) vaccine  If recommended by your health care provider, you may need three doses over 6 months. Measles, mumps, and rubella (MMR) vaccine  You may need at least one dose of MMR. You may also need a second dose. Meningococcal conjugate (MenACWY) vaccine  One dose is recommended if you are age 19-21 years and a first-year college student living in a residence hall, or if you have one of several medical conditions. You may also need additional booster doses. Pneumococcal conjugate (PCV13) vaccine  You may need  this if you have certain conditions and were not previously vaccinated. Pneumococcal polysaccharide (PPSV23) vaccine  You may need one or two doses if you smoke cigarettes or if you have certain conditions. Hepatitis A vaccine  You may need this if you have certain conditions or if you travel or work in places where you may be exposed to hepatitis A. Hepatitis B vaccine  You may need this if you have certain conditions or if you travel or work in places where you may be exposed to hepatitis B. Haemophilus influenzae type b (Hib) vaccine  You may need this if you have certain conditions. You may receive vaccines as individual doses or as more than one vaccine together in one shot (combination vaccines). Talk with your health care provider about the risks and benefits of combination vaccines. What tests do I need?  Blood tests  Lipid and cholesterol levels. These may be checked every 5 years starting at age 20.  Hepatitis C test.  Hepatitis B test. Screening  Diabetes screening. This is done by checking your blood sugar (glucose) after you have not eaten for a while (fasting).  Sexually transmitted disease (STD) testing.  BRCA-related cancer screening. This may be done if you have a family history of breast, ovarian, tubal, or peritoneal cancers.  Pelvic exam and Pap test. This may be done every 3 years starting at age 21. Starting at age 30, this may be done every 5 years if you have a Pap test in combination with an HPV test. Talk with your health care provider about your test results, treatment options, and if necessary, the need for more tests.   Follow these instructions at home: Eating and drinking   Eat a diet that includes fresh fruits and vegetables, whole grains, lean protein, and low-fat dairy.  Take vitamin and mineral supplements as recommended by your health care provider.  Do not drink alcohol if: ? Your health care provider tells you not to drink. ? You are  pregnant, may be pregnant, or are planning to become pregnant.  If you drink alcohol: ? Limit how much you have to 0-1 drink a day. ? Be aware of how much alcohol is in your drink. In the [place] one drink equals one 12 oz bottle of beer (355 mL), one 5 oz glass of wine (148 mL), or one 1 oz glass of hard liquor (44 mL). Lifestyle  Take daily care of your teeth and gums.  Stay active. Exercise for at least 30 minutes on 5 or more days each week.  Do not use any products that contain nicotine or tobacco, such as cigarettes, e-cigarettes, and chewing tobacco. If you need help quitting, ask your health care provider.  If you are sexually active, practice safe sex. Use a condom or other form of birth control (contraception) in order to prevent pregnancy and STIs (sexually transmitted infections). If you plan to become pregnant, see your health care provider for a preconception visit. What's next?  Visit your health care provider once a year for a well check visit.  Ask your health care provider how often you should have your eyes and teeth checked.  Stay up to date on all vaccines. This information is not intended to replace advice given to you by your health care provider. Make sure you discuss any questions you have with your health care provider. Document Revised: 12/23/2017 Document Reviewed: 12/23/2017 Elsevier Patient Education  2020 [employer].

## 2019-12-12 NOTE — Progress Notes (Signed)
Subjective:     [name] is a 29 y.o. female and is here for a comprehensive physical exam. The patient reports no problems.  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  She says everything going on with Covid has been stressful.  She has had a little abscess on her mid chest area for couple of weeks she said it was larger and its been getting a little smaller she says it was draining but has not been draining the last couple days she does feel like it is probably getting a little better.  Social History   Socioeconomic History  . Marital status: Single    Spouse name: Not on file  . Number of children: Not on file  . Years of education: HS  . Highest education level: Not on file  Occupational History  . Occupation: Med-Tech    Comment: [employer]   Tobacco Use  . Smoking status: Never Smoker  . Smokeless tobacco: Never Used  Substance and Sexual Activity  . Alcohol use: No  . Drug use: No  . Sexual activity: Yes    Birth control/protection: I.U.D.  Other Topics Concern  . Not on file  Social History Narrative   Lives with mother , brother, stepfather.  2 caffeinated drinks per day.  Mom is Dietrich Pates. 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    Social Determinants of Health   Financial Resource Strain:   . Difficulty of Paying Living Expenses:   Food Insecurity:   . Worried About Programme researcher, broadcasting/film/video in the Last Year:   . Barista in the Last Year:   Transportation Needs:   . Freight forwarder (Medical):   Marland Kitchen Lack of Transportation (Non-Medical):   Physical Activity:   . Days of Exercise per Week:   . Minutes of Exercise per Session:   Stress:   . Feeling of Stress :   Social Connections:   . Frequency of Communication with Friends and Family:   . Frequency of Social Gatherings with Friends and Family:   . Attends Religious Services:   . Active Member of Clubs or Organizations:   . Attends Banker  Meetings:   Marland Kitchen Marital Status:   Intimate Partner Violence:   . Fear of Current or Ex-Partner:   . Emotionally Abused:   Marland Kitchen Physically Abused:   . Sexually Abused:    Health Maintenance  Topic Date Due  . Hepatitis C Screening  Never done  . COVID-19 Vaccine (1) Never done  . INFLUENZA VACCINE  11/26/2019  . PAP-Cervical Cytology Screening  07/20/2020 (Originally 01/04/2015)  . PAP SMEAR-Modifier  07/20/2020 (Originally 01/04/2015)  . TETANUS/TDAP  11/18/2026  . HIV Screening  Completed    The following portions of the patient's history were reviewed and updated as appropriate: allergies, current medications, past family history, past medical history, past social history, past surgical history and problem list.  Review of Systems A comprehensive review of systems was negative.   Objective:    BP 129/69   Pulse (!) 108   Ht 5\' 5"  (1.651 m)   Wt (!) 313 lb (142 kg)   LMP 11/27/2019 (Exact Date)   SpO2 97%   BMI 52.09 kg/m  General appearance: alert, cooperative and appears stated age Head: Normocephalic, without obvious abnormality, atraumatic Eyes: conj clear, EOMI, PEERLA Ears: normal TM's and external ear canals both ears Nose: Nares normal. Septum midline. Mucosa  normal. No drainage or sinus tenderness. Throat: lips, mucosa, and tongue normal; teeth and gums normal Neck: no adenopathy, no carotid bruit, no JVD, supple, symmetrical, trachea midline and thyroid not enlarged, symmetric, no tenderness/mass/nodules Back: symmetric, no curvature. ROM normal. No CVA tenderness. Lungs: clear to auscultation bilaterally Breasts: normal appearance, no masses or tenderness Heart: regular rate and rhythm, S1, S2 normal, no murmur, click, rub or gallop Abdomen: soft, non-tender; bowel sounds normal; no masses,  no organomegaly Pelvic: cervix normal in appearance, external genitalia normal, no adnexal masses or tenderness, no cervical motion tenderness, rectovaginal septum normal, uterus  normal size, shape, and consistency and vagina normal without discharge Extremities: extremities normal, atraumatic, no cyanosis or edema Pulses: 2+ and symmetric Skin: Skin color, texture, turgor normal. No rashes or lesions, does have what looks like a small healing abscess in over the mid sternum between the breast area. Lymph nodes: Cervical, supraclavicular, and axillary nodes normal. Neurologic: Alert and oriented X 3, normal strength and tone. Normal symmetric reflexes. Normal coordination and gait    Assessment:    Healthy female exam.     Plan:     See After Visit Summary for Counseling Recommendations   Keep up a regular exercise program and make sure you are eating a healthy diet Try to eat 4 servings of dairy a day, or if you are lactose intolerant take a calcium with vitamin D daily.  Your vaccines are up to date.   Abscess-seems to be healing on its own but I did give her prescription for some mupirocin ointment.  I do think she would benefit from breast reduction and we discussed that today.  She is currently a size H and frequently gets small abscesses and folliculitis around the breast tissue.  Also causes strain on her shoulders and indentations from her bra straps as well as some upper back pain.  Encouraged her to check with her insurance a nd consider consultation with plastic surgery.

## 2019-12-13 LAB — HIV ANTIBODY (ROUTINE TESTING W REFLEX): HIV 1&2 Ab, 4th Generation: NONREACTIVE

## 2019-12-13 LAB — CYTOLOGY - PAP
Adequacy: ABSENT
Chlamydia: NEGATIVE
Comment: NEGATIVE
Comment: NORMAL
Diagnosis: NEGATIVE
Neisseria Gonorrhea: NEGATIVE

## 2019-12-13 LAB — RPR: RPR Ser Ql: NONREACTIVE

## 2019-12-13 LAB — HEPATITIS C ANTIBODY
Hepatitis C Ab: NONREACTIVE
SIGNAL TO CUT-OFF: 0.01 (ref <1.00)

## 2019-12-13 NOTE — Progress Notes (Signed)
Call patient: Your Pap smear is normal. Repeat in 3 years.  You are also negative for gonorrhea and chlamydia.

## 2020-03-05 ENCOUNTER — Encounter: Payer: Self-pay | Admitting: Family Medicine

## 2020-03-05 ENCOUNTER — Ambulatory Visit (INDEPENDENT_AMBULATORY_CARE_PROVIDER_SITE_OTHER): Payer: 59 | Admitting: Family Medicine

## 2020-03-05 VITALS — BP 134/79 | HR 117 | Ht 65.0 in | Wt 310.0 lb

## 2020-03-05 DIAGNOSIS — R0789 Other chest pain: Secondary | ICD-10-CM

## 2020-03-05 DIAGNOSIS — R0602 Shortness of breath: Secondary | ICD-10-CM | POA: Diagnosis not present

## 2020-03-05 DIAGNOSIS — R000 Tachycardia, unspecified: Secondary | ICD-10-CM | POA: Diagnosis not present

## 2020-03-05 MED ORDER — PREDNISONE 20 MG PO TABS: 40.0000 mg | ORAL_TABLET | Freq: Every day | ORAL | 0 refills | Status: DC

## 2020-03-05 NOTE — Progress Notes (Signed)
Established Patient Office Visit  Subjective:  Patient ID: [name], female    DOB: 1991-01-30  Age: 29 y.o. MRN: [id number]  CC:  Chief Complaint  Patient presents with  . Shortness of Breath    HPI [name] presents for SOB and cough x 3 weeks. Has been to UC twice in [place]. Given tessalon Perles and cough syrup.  Negative for COVID.  Tightness in her chest with talking.  Getting SOb with exercise. Proair help some, maybe for 2 hours. No fever, chills sweat, nausea or vomiting diarrhea. Worse at night.  No GERD recently.  She had a neg CXR 2.4 weeks ago.   She has not been around anybody who has been ill she is not had any swallowing difficulty.  No recent travel.  She says she really just goes to the grocery store and works from home.  She is not currently on any type of hormone therapy or birth control she is not been using any decongestants.  She says sometimes she will hear a wheeze in her chest particularly if she lays on her side.  Has a lot of pressure and discomfort in her mid back when she lays down at night.  Past Medical History:  Diagnosis Date  . Diabetes mellitus without complication (HCC)   . Hypertension   . Obesity     History reviewed. No pertinent surgical history.  Family History  Problem Relation Age of Onset  . Alcohol abuse Father   . Breast cancer Unknown        aunt  . Heart attack Unknown        grandfather  . Depression Unknown        aunt  . Diabetes Unknown        grandmother  . Hypertension Mother     Social History   Socioeconomic History  . Marital status: Single    Spouse name: Not on file  . Number of children: Not on file  . Years of education: HS  . Highest education level: Not on file  Occupational History  . Occupation: Med-Tech    Comment: [employer]   Tobacco Use  . Smoking status: Never Smoker  . Smokeless tobacco: Never Used  Substance and Sexual Activity  . Alcohol use: No  . Drug use: No  .  Sexual activity: Yes    Birth control/protection: I.U.D.  Other Topics Concern  . Not on file  Social History Narrative   Lives with mother , brother, stepfather.  2 caffeinated drinks per day.  Mom is [name]. 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    Social Determinants of Health   Financial Resource Strain:   . Difficulty of Paying Living Expenses: Not on file  Food Insecurity:   . Worried About Programme researcher, broadcasting/film/video in the Last Year: Not on file  . Ran Out of Food in the Last Year: Not on file  Transportation Needs:   . Lack of Transportation (Medical): Not on file  . Lack of Transportation (Non-Medical): Not on file  Physical Activity:   . Days of Exercise per Week: Not on file  . Minutes of Exercise per Session: Not on file  Stress:   . Feeling of Stress : Not on file  Social Connections:   . Frequency of Communication with Friends and Family: Not on file  . Frequency of Social Gatherings with Friends and Family: Not on file  . Attends Religious Services: Not on file  . Active  Member of Clubs or Organizations: Not on file  . Attends Banker Meetings: Not on file  . Marital Status: Not on file  Intimate Partner Violence:   . Fear of Current or Ex-Partner: Not on file  . Emotionally Abused: Not on file  . Physically Abused: Not on file  . Sexually Abused: Not on file    Outpatient Medications Prior to Visit  Medication Sig Dispense Refill  . albuterol (VENTOLIN HFA) 108 (90 Base) MCG/ACT inhaler Inhale into the lungs.    . EUCRISA 2 % OINT Apply topically.    . mupirocin ointment (BACTROBAN) 2 % Apply topically 2 (two) times daily. Apply to inside of each nares daily for 10 days then twice a week for maintenance. 30 g 0  . triamcinolone ointment (KENALOG) 0.1 % Apply      to itchy area twice daily x 2-3 weeks or until clear.     No facility-administered medications prior to visit.    Allergies  Allergen Reactions  . Shellfish Allergy      ROS Review of Systems    Objective:    Physical Exam Constitutional:      Appearance: She is well-developed.  HENT:     Head: Normocephalic and atraumatic.     Right Ear: Tympanic membrane, ear canal and external ear normal.     Left Ear: Tympanic membrane, ear canal and external ear normal.     Nose: Nose normal.     Mouth/Throat:     Mouth: Mucous membranes are moist.     Pharynx: Oropharynx is clear. No oropharyngeal exudate or posterior oropharyngeal erythema.  Eyes:     Conjunctiva/sclera: Conjunctivae normal.     Pupils: Pupils are equal, round, and reactive to light.  Neck:     Thyroid: No thyromegaly.  Cardiovascular:     Rate and Rhythm: Regular rhythm. Tachycardia present.     Heart sounds: Normal heart sounds.  Pulmonary:     Effort: Pulmonary effort is normal. No tachypnea.     Breath sounds: Normal breath sounds. No wheezing.  Musculoskeletal:     Cervical back: Neck supple.  Lymphadenopathy:     Cervical: No cervical adenopathy.  Skin:    General: Skin is warm and dry.  Neurological:     Mental Status: She is alert and oriented to person, place, and time.     BP 134/79   Pulse (!) 117   Ht 5\' 5"  (1.651 m)   Wt (!) 310 lb (140.6 kg)   SpO2 99%   BMI 51.59 kg/m  Wt Readings from Last 3 Encounters:  03/05/20 (!) 310 lb (140.6 kg)  12/12/19 (!) 313 lb (142 kg)  07/21/19 (!) 316 lb (143.3 kg)     Health Maintenance Due  Topic Date Due  . COVID-19 Vaccine (1) Never done  . INFLUENZA VACCINE  Never done    There are no preventive care reminders to display for this patient.  Lab Results  Component Value Date   TSH 0.90 05/25/2019   Lab Results  Component Value Date   WBC 13.7 06/22/2019   HGB 12.9 06/22/2019   HCT 39 06/22/2019   MCV 78.1 01/17/2014   PLT 544 (A) 06/22/2019   Lab Results  Component Value Date   NA 141 05/25/2019   K 4.9 05/25/2019   CO2 25 (A) 05/25/2019   GLUCOSE 82 01/17/2014   BUN 10 01/17/2014    CREATININE 0.7 05/25/2019   BILITOT 0.8 01/17/2014  ALKPHOS 136 (A) 05/25/2019   AST 13 05/25/2019   ALT 10 05/25/2019   PROT 7.6 01/17/2014   ALBUMIN 4.2 05/25/2019   CALCIUM 10.0 05/25/2019   Lab Results  Component Value Date   CHOL 154 05/25/2019   Lab Results  Component Value Date   HDL 57 05/25/2019   Lab Results  Component Value Date   LDLCALC 84 05/25/2019   Lab Results  Component Value Date   TRIG 62 05/25/2019   No results found for: CHOLHDL Lab Results  Component Value Date   HGBA1C 5.9 05/25/2019      Assessment & Plan:   Problem List Items Addressed This Visit    None    Visit Diagnoses    SOB (shortness of breath)    -  Primary   Relevant Orders   COMPLETE METABOLIC PANEL WITH GFR   TSH   D-dimer, quantitative (not at [facility])   CBC with Differential/Platelet   EKG 12-Lead   Chest tightness       Relevant Orders   COMPLETE METABOLIC PANEL WITH GFR   TSH   D-dimer, quantitative (not at [facility])   CBC with Differential/Platelet   EKG 12-Lead   Tachycardia         Shortness of breath with chest tightness-history most consistent with asthma-like symptoms.  Peak flows in the yellow zone today.  Normal chest x-ray about 2-1/2 weeks ago.  We will treat more aggressively with prednisone and increasing her albuterol to 4 puffs every 6 hours during the daytime to see if this provides any temporary relief.  EKG was normal.  We will also consider pulmonary embolism so we will check a D-dimer as well as labs today.  Will call with results once available she is not on any birth control and no recent extended travel.  Tachycardia-did encourage her to cut back on caffeine to see if this makes a difference she did recently get a [place]  EKG done today shows rate of 118 bpm, normal sinus rhythm with no acute ST-T wave changes.  Meds ordered this encounter  Medications  . predniSONE (DELTASONE) 20 MG tablet    Sig: Take 2 tablets (40 mg total) by mouth daily with  breakfast.    Dispense:  10 tablet    Refill:  0    Follow-up: Return if symptoms worsen or fail to improve.     Nani Gasser, MD

## 2020-03-05 NOTE — Patient Instructions (Addendum)
Increase albuterol to 4 puffs every 6 hours during the daytime.  Avoid right before you go to bed because it can keep you awake at night. Scription sent for prednisone.  Cut back on your caffeine intake.  Your EKG is OK.

## 2020-03-06 DIAGNOSIS — I509 Heart failure, unspecified: Secondary | ICD-10-CM | POA: Insufficient documentation

## 2020-03-06 LAB — TSH: TSH: 1.12 mIU/L

## 2020-03-06 LAB — COMPLETE METABOLIC PANEL WITH GFR
AG Ratio: 1.4 (calc) (ref 1.0–2.5)
ALT: 12 U/L (ref 6–29)
AST: 15 U/L (ref 10–30)
Albumin: 4.2 g/dL (ref 3.6–5.1)
Alkaline phosphatase (APISO): 101 U/L (ref 31–125)
BUN: 11 mg/dL (ref 7–25)
CO2: 27 mmol/L (ref 20–32)
Calcium: 10 mg/dL (ref 8.6–10.2)
Chloride: 105 mmol/L (ref 98–110)
Creat: 0.79 mg/dL (ref 0.50–1.10)
GFR, Est African American: 118 mL/min/{1.73_m2} (ref >=60)
GFR, Est Non African American: 102 mL/min/{1.73_m2} (ref >=60)
Globulin: 3.1 g/dL (calc) (ref 1.9–3.7)
Glucose, Bld: 90 mg/dL (ref 65–99)
Potassium: 4.8 mmol/L (ref 3.5–5.3)
Sodium: 140 mmol/L (ref 135–146)
Total Bilirubin: 0.7 mg/dL (ref 0.2–1.2)
Total Protein: 7.3 g/dL (ref 6.1–8.1)

## 2020-03-06 LAB — CBC WITH DIFFERENTIAL/PLATELET
Absolute Monocytes: 925 cells/uL (ref 200–950)
Basophils Absolute: 54 cells/uL (ref 0–200)
Basophils Relative: 0.4 %
Eosinophils Absolute: 389 cells/uL (ref 15–500)
Eosinophils Relative: 2.9 %
HCT: 40.3 % (ref 35.0–45.0)
Hemoglobin: 12.8 g/dL (ref 11.7–15.5)
Lymphs Abs: 3323 cells/uL (ref 850–3900)
MCH: 25.5 pg — ABNORMAL LOW (ref 27.0–33.0)
MCHC: 31.8 g/dL — ABNORMAL LOW (ref 32.0–36.0)
MCV: 80.4 fL (ref 80.0–100.0)
MPV: 11 fL (ref 7.5–12.5)
Monocytes Relative: 6.9 %
Neutro Abs: 8710 cells/uL — ABNORMAL HIGH (ref 1500–7800)
Neutrophils Relative %: 65 %
Platelets: 508 10*3/uL — ABNORMAL HIGH (ref 140–400)
RBC: 5.01 10*6/uL (ref 3.80–5.10)
RDW: 13.6 % (ref 11.0–15.0)
Total Lymphocyte: 24.8 %
WBC: 13.4 10*3/uL — ABNORMAL HIGH (ref 3.8–10.8)

## 2020-03-06 LAB — EXTRA SPECIMEN

## 2020-03-06 LAB — D-DIMER, QUANTITATIVE: D-Dimer, Quant: 1.42 mcg/mL FEU — ABNORMAL HIGH (ref <0.50)

## 2020-04-22 DIAGNOSIS — I5022 Chronic systolic (congestive) heart failure: Secondary | ICD-10-CM | POA: Insufficient documentation

## 2020-07-15 ENCOUNTER — Encounter: Payer: Self-pay | Admitting: Family Medicine

## 2020-09-19 ENCOUNTER — Encounter: Payer: Self-pay | Admitting: Family Medicine

## 2020-09-19 ENCOUNTER — Ambulatory Visit: Payer: 59 | Admitting: Family Medicine

## 2020-09-19 ENCOUNTER — Other Ambulatory Visit: Payer: Self-pay | Admitting: Family Medicine

## 2020-09-19 ENCOUNTER — Other Ambulatory Visit: Payer: Self-pay

## 2020-09-19 VITALS — BP 132/78 | HR 120 | Temp 98.0°F | Ht 65.0 in | Wt 303.2 lb

## 2020-09-19 DIAGNOSIS — H60502 Unspecified acute noninfective otitis externa, left ear: Secondary | ICD-10-CM | POA: Diagnosis not present

## 2020-09-19 DIAGNOSIS — J069 Acute upper respiratory infection, unspecified: Secondary | ICD-10-CM

## 2020-09-19 MED ORDER — CIPRO HC 0.2-1 % OT SUSP: 3.0000 [drp] | Freq: Two times a day (BID) | OTIC | 0 refills | Status: DC

## 2020-09-19 MED ORDER — PHENOL 1.4 % MT LIQD: 1.0000 | OROMUCOSAL | 1 refills | Status: DC | PRN

## 2020-09-19 NOTE — Progress Notes (Signed)
Acute Office Visit  Subjective:    Patient ID: [name], female    DOB: October 15, 1990, 30 y.o.   MRN: [id number]  Chief Complaint  Patient presents with  . URI    HPI Patient is in today for upper URI and left ear irritation.  Last night patient started feeling bad with a sore throat and runny nose. Reports she feels even worse today. She is having nasal congestion, PND, clear rhinorrhea, sore/itchy throat, and left ear canal irritation, soreness, and itching. She has not yet taken anything. She denies headache, sinus pressure, nausea, vomiting, diarrhea, chest pain, dyspnea, body aches, fevers. COVID negative yesterday and today. Son recently had a cold and now has AOM.    Past Medical History:  Diagnosis Date  . Diabetes mellitus without complication (HCC)   . Hypertension   . Obesity     No past surgical history on file.  Family History  Problem Relation Age of Onset  . Alcohol abuse Father   . Breast cancer Other        aunt  . Heart attack Other        grandfather  . Depression Other        aunt  . Diabetes Other        grandmother  . Hypertension Mother     Social History   Socioeconomic History  . Marital status: Single    Spouse name: Not on file  . Number of children: Not on file  . Years of education: HS  . Highest education level: Not on file  Occupational History  . Occupation: Med-Tech    Comment: [employer]   Tobacco Use  . Smoking status: Never Smoker  . Smokeless tobacco: Never Used  Substance and Sexual Activity  . Alcohol use: No  . Drug use: No  . Sexual activity: Yes    Birth control/protection: I.U.D.  Other Topics Concern  . Not on file  Social History Narrative   Lives with mother , brother, stepfather.  2 caffeinated drinks per day.  Mom is Dietrich Pates. 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    Social Determinants of Health   Financial Resource Strain: Not on file  Food Insecurity: Not on file  Transportation  Needs: Not on file  Physical Activity: Not on file  Stress: Not on file  Social Connections: Not on file  Intimate Partner Violence: Not on file    Outpatient Medications Prior to Visit  Medication Sig Dispense Refill  . furosemide (LASIX) 40 MG tablet Take 40 mg by mouth daily.    . Metoprolol Succinate 25 MG CS24 Take 75 mg by mouth daily.    . sacubitril-valsartan (ENTRESTO) 49-51 MG Take 1 tablet by mouth daily.    Marland Kitchen spironolactone (ALDACTONE) 12.5 mg TABS tablet Take 12.5 mg by mouth daily.    Marland Kitchen albuterol (VENTOLIN HFA) 108 (90 Base) MCG/ACT inhaler Inhale into the lungs.    . EUCRISA 2 % OINT Apply topically.    . predniSONE (DELTASONE) 20 MG tablet Take 2 tablets (40 mg total) by mouth daily with breakfast. 10 tablet 0   No facility-administered medications prior to visit.    Allergies  Allergen Reactions  . Shellfish Allergy     Review of Systems All review of systems negative except what is listed in the HPI     Objective:    Physical Exam Vitals reviewed.  Constitutional:      Appearance: Normal appearance. She is obese.  HENT:  Head: Normocephalic and atraumatic.     Right Ear: Tympanic membrane normal.     Ears:     Comments: Left canal with mild erythema    Mouth/Throat:     Tonsils: No tonsillar exudate or tonsillar abscesses. 0 on the right. 0 on the left.  Cardiovascular:     Rate and Rhythm: Regular rhythm.     Heart sounds: Normal heart sounds.  Pulmonary:     Breath sounds: Normal breath sounds.  Musculoskeletal:     Cervical back: Normal range of motion and neck supple. No rigidity or tenderness.  Lymphadenopathy:     Cervical: No cervical adenopathy.  Skin:    General: Skin is warm and dry.     Capillary Refill: Capillary refill takes less than 2 seconds.     Findings: No rash.  Neurological:     Mental Status: She is alert and oriented to person, place, and time.  Psychiatric:        Mood and Affect: Mood normal.        Behavior:  Behavior normal.        Thought Content: Thought content normal.        Judgment: Judgment normal.     Pulse (!) 120   Temp 98 F (36.7 C) (Oral)   Ht 5\' 5"  (1.651 m)   Wt (!) 303 lb 3.2 oz (137.5 kg)   SpO2 97%   BMI 50.46 kg/m  Wt Readings from Last 3 Encounters:  09/19/20 (!) 303 lb 3.2 oz (137.5 kg)  03/05/20 (!) 310 lb (140.6 kg)  12/12/19 (!) 313 lb (142 kg)    Health Maintenance Due  Topic Date Due  . COVID-19 Vaccine (1) Never done    There are no preventive care reminders to display for this patient.   Lab Results  Component Value Date   TSH 1.12 03/05/2020   Lab Results  Component Value Date   WBC 13.4 (H) 03/05/2020   HGB 12.8 03/05/2020   HCT 40.3 03/05/2020   MCV 80.4 03/05/2020   PLT 508 (H) 03/05/2020   Lab Results  Component Value Date   NA 140 03/05/2020   K 4.8 03/05/2020   CO2 27 03/05/2020   GLUCOSE 90 03/05/2020   BUN 11 03/05/2020   CREATININE 0.79 03/05/2020   BILITOT 0.7 03/05/2020   ALKPHOS 136 (A) 05/25/2019   AST 15 03/05/2020   ALT 12 03/05/2020   PROT 7.3 03/05/2020   ALBUMIN 4.2 05/25/2019   CALCIUM 10.0 03/05/2020   Lab Results  Component Value Date   CHOL 154 05/25/2019   Lab Results  Component Value Date   HDL 57 05/25/2019   Lab Results  Component Value Date   LDLCALC 84 05/25/2019   Lab Results  Component Value Date   TRIG 62 05/25/2019   No results found for: [facility] Lab Results  Component Value Date   HGBA1C 5.9 05/25/2019       Assessment & Plan:   1. Acute otitis externa of left ear, unspecified type Mild erythema with some itching/soreness - will treat with abx-steroid drops for the next several days as she has no deep pain or TM changes. Encouraged her not to use qtips.  - ciprofloxacin-hydrocortisone (CIPRO HC) OTIC suspension; Place 3 drops into the left ear 2 (two) times daily.  Dispense: 10 mL; Refill: 0  2. Viral URI Less than 48 hours of symptoms so far. Recommending conservative  management for now including Mucinex, flonase, zyrtec, rest, hydration,  humidifier use, warm compresses, steam showers, warm liquids, honey/lemon, OTC cold medicines/analgesics. If no improvement by next week or if symptoms worsen can reevaluate and treat for ABRS if indicated. Patient aware of signs/symptoms requiring further/urgent evaluation.  - phenol (CHLORASEPTIC) 1.4 % LIQD; Use as directed 1 spray in the mouth or throat as needed for throat irritation / pain.  Dispense: 177 mL; Refill: 1  Follow-up if symptoms worsen or fail to improve.   Clayborne Dana, NP

## 2020-09-19 NOTE — Patient Instructions (Signed)
Mucinex, Zyrtec, over the counter cold medicines, tylenol, ibprofen, etc.  Humidifier, steam showers, warm liquids with honey and tea  Let us know if not feeling some improvement after the weekend or if symptoms worsen.

## 2021-03-04 ENCOUNTER — Encounter: Payer: Self-pay | Admitting: Family Medicine

## 2021-03-04 ENCOUNTER — Telehealth (INDEPENDENT_AMBULATORY_CARE_PROVIDER_SITE_OTHER): Payer: 59 | Admitting: Family Medicine

## 2021-03-04 VITALS — Temp 97.3°F | Ht 65.0 in | Wt 310.0 lb

## 2021-03-04 DIAGNOSIS — J069 Acute upper respiratory infection, unspecified: Secondary | ICD-10-CM

## 2021-03-04 MED ORDER — HYDROCODONE BIT-HOMATROP MBR 5-1.5 MG/5ML PO SOLN: 5.0000 mL | Freq: Two times a day (BID) | ORAL | 0 refills | Status: DC | PRN

## 2021-03-04 MED ORDER — ALBUTEROL SULFATE HFA 108 (90 BASE) MCG/ACT IN AERS: 2.0000 | INHALATION_SPRAY | Freq: Four times a day (QID) | RESPIRATORY_TRACT | 0 refills | Status: DC | PRN

## 2021-03-04 NOTE — Progress Notes (Signed)
Virtual Visit via Video Note  I connected with Sharlot Roanhorse on 03/04/21 at 10:10 AM EST by a video enabled telemedicine application and verified that I am speaking with the correct person using two identifiers.   I discussed the limitations of evaluation and management by telemedicine and the availability of in person appointments. The patient expressed understanding and agreed to proceed.  Patient location: at home Provider location: in office  Subjective:    CC: Cough  HPI: Friday night. Scratchy throat and  runny nose. She took COVID test Saturday,yesterday and this morning all were negative. She slept a lot.  She works from home. She has a horrible cough, runny nose,scratchy throat,sinus drainage. She has been taking Alka seltzer and ny quil, and herbal tea. Can't smell or taste.  + wheezing.   Her temp was at 99 Sunday 98.3 yesterday. + diarrhea on Sat.    No SOB. Denies any sick contacts.   Past medical history, Surgical history, Family history not pertinant except as noted below, Social history, Allergies, and medications have been entered into the medical record, reviewed, and corrections made.    Objective:    General: Speaking clearly in complete sentences without any shortness of breath.  Alert and oriented x3.  Normal judgment. No apparent acute distress.    Impression and Recommendations:    No problem-specific Assessment & Plan notes found for this encounter.  URI - likely viral.  We discussed symptomatic care.  Prescription sent for albuterol to use as needed for wheezing.  Also sent hydrocodone cough syrup for bedtime.  She does feel little better today so I am really hopeful that she is actually improving.  If she suddenly gets worse or develops new symptoms then please let us know.  We did discuss possibly testing for COVID using PCR and or flu.  But tomorrow will be day 5 in which case she would not be a candidate for antivirals at that point.   No orders of  the defined types were placed in this encounter.   Meds ordered this encounter  Medications   albuterol (VENTOLIN HFA) 108 (90 Base) MCG/ACT inhaler    Sig: Inhale 2 puffs into the lungs every 6 (six) hours as needed for wheezing or shortness of breath.    Dispense:  8 g    Refill:  0   HYDROcodone bit-homatropine (HYCODAN) 5-1.5 MG/5ML syrup    Sig: Take 5 mLs by mouth 2 (two) times daily as needed for cough.    Dispense:  60 mL    Refill:  0      I discussed the assessment and treatment plan with the patient. The patient was provided an opportunity to ask questions and all were answered. The patient agreed with the plan and demonstrated an understanding of the instructions.   The patient was advised to call back or seek an in-person evaluation if the symptoms worsen or if the condition fails to improve as anticipated.   Nani Gasser, MD

## 2021-03-04 NOTE — Progress Notes (Signed)
Friday night. Scratchy throat runny nose. She took COVID test Saturday,yesterday and this morning all were negative.  She has a horrible cough, runny nose,scratchy throat,sinus drainage. She has been taking Alka seltzer and ny quil, and herbal tea.   Her temp was at 99 Sunday 98.3 yesterday.   No SOB. Denies any sick contacts.

## 2021-03-05 ENCOUNTER — Encounter: Payer: Self-pay | Admitting: *Deleted

## 2021-03-06 ENCOUNTER — Telehealth: Payer: 59 | Admitting: Family Medicine

## 2021-06-27 ENCOUNTER — Telehealth: Payer: 59 | Admitting: Family Medicine

## 2021-06-27 ENCOUNTER — Encounter: Payer: Self-pay | Admitting: Family Medicine

## 2021-06-27 DIAGNOSIS — L308 Other specified dermatitis: Secondary | ICD-10-CM

## 2021-06-27 DIAGNOSIS — B001 Herpesviral vesicular dermatitis: Secondary | ICD-10-CM

## 2021-06-27 MED ORDER — VALACYCLOVIR HCL 1 G PO TABS: 2000.0000 mg | ORAL_TABLET | Freq: Two times a day (BID) | ORAL | 1 refills | Status: AC

## 2021-06-27 MED ORDER — TRIAMCINOLONE ACETONIDE 0.1 % EX OINT: 1.0000 "application " | TOPICAL_OINTMENT | Freq: Two times a day (BID) | CUTANEOUS | 1 refills | Status: AC | PRN

## 2021-06-27 NOTE — Progress Notes (Signed)
Pt reports that her Eczema began to flare up about 2 weeks ago.  She has been using Eucerin and Triamcinolone 0.1% PRN she states that she ran out of the Triamcinolone and the Dermatologist that she usually sees has moved to [facility] and was unable to get in for several months. She stated that she did try contacting offices closer to her home however, no one had any openings.  ? ? ? ?She stated that that she was prescribed the Triamcinolone 0.1% 454 G BID PRN  and that this has lasted her for about 7 months.  ?

## 2021-06-27 NOTE — Progress Notes (Signed)
? ? ?Virtual Visit via Video Note ? ?I connected with [name] on 06/27/21 at  1:00 PM EST by a video enabled telemedicine application and verified that I am speaking with the correct person using two identifiers. ?  ?I discussed the limitations of evaluation and management by telemedicine and the availability of in person appointments. The patient expressed understanding and agreed to proceed. ? ?Patient location: at home ?Provider location: in office ? ?Subjective:   ? ?CC:   ?Chief Complaint  ?Patient presents with  ? Eczema  ? ? ?HPI: ?Pt reports that her Eczema began to flare up about 2 weeks ago.  She has been using Eucerin and Triamcinolone 0.1% PRN she states that she ran out of the Triamcinolone and the Dermatologist that she usually sees has moved to [facility] and was unable to get in for several months. She stated that she did try contacting offices closer to her home however, no one had any openings.  ?  ? She stated that that she was prescribed the Triamcinolone 0.1% 454 G BID PRN  and that this has lasted her for about 7 months.  ? ?She also wanted to discuss cold sores.  She currently has an outbreak on her lower lip.  She says the gum is started at the bottom of her lip and then seem to MS moves upward and missing a line.  She normally gets an outbreak about twice a year.  Typically uses Abreva but wants to know if there is anything else that she could use. ? ?Past medical history, Surgical history, Family history not pertinant except as noted below, Social history, Allergies, and medications have been entered into the medical record, reviewed, and corrections made.  ? ? ?Objective:   ? ?General: Speaking clearly in complete sentences without any shortness of breath.  Alert and oriented x3.  Normal judgment. No apparent acute distress.  She does have an active cold sore on the bottom lip today.  She also has significant hyperpigmentation and erythematous papules over the antecubital fossa.  She has  similar areas behind the knees as well. ? ? ? ?Impression and Recommendations:   ? ?Problem List Items Addressed This Visit   ? ?  ? Digestive  ? Cold sore  ?  Will treat with oral acyclovir prior episodes that she really only gets about 2 outbreaks per year.  2 tabs twice a day for 1 day.  K to still use topical Abreva at the same time. ?  ?  ? Relevant Medications  ? valACYclovir (VALTREX) 1000 MG tablet  ?  ? Musculoskeletal and Integument  ? Eczema - Primary  ?  Current regimen works really well for her.  Will take over prescribing topical steroid.  Prescription sent to pharmacy.  Call if any problems or concerns or if flare not improving. ?  ?  ? Relevant Medications  ? triamcinolone ointment (KENALOG) 0.1 %  ? ? ?No orders of the defined types were placed in this encounter. ? ? ?Meds ordered this encounter  ?Medications  ? triamcinolone ointment (KENALOG) 0.1 %  ?  Sig: Apply 1 application topically 2 (two) times daily as needed. Given skin a break every 2 weeks if using daily  ?  Dispense:  453.6 g  ?  Refill:  1  ? valACYclovir (VALTREX) 1000 MG tablet  ?  Sig: Take 2 tablets (2,000 mg total) by mouth 2 (two) times daily for 1 day. For out break of Cold Sore  ?  Dispense:  20 tablet  ?  Refill:  1  ? ? ? ?I discussed the assessment and treatment plan with the patient. The patient was provided an opportunity to ask questions and all were answered. The patient agreed with the plan and demonstrated an understanding of the instructions. ?  ?The patient was advised to call back or seek an in-person evaluation if the symptoms worsen or if the condition fails to improve as anticipated. ? ? ?Nani Gasser, MD  ? ?

## 2021-06-27 NOTE — Assessment & Plan Note (Signed)
Current regimen works really well for her.  Will take over prescribing topical steroid.  Prescription sent to pharmacy.  Call if any problems or concerns or if flare not improving. ?

## 2021-06-27 NOTE — Assessment & Plan Note (Signed)
Will treat with oral acyclovir prior episodes that she really only gets about 2 outbreaks per year.  2 tabs twice a day for 1 day.  K to still use topical Abreva at the same time. ?

## 2021-07-09 ENCOUNTER — Encounter: Payer: Self-pay | Admitting: Family Medicine

## 2021-07-09 MED ORDER — SCOPOLAMINE 1 MG/3DAYS TD PT72: 1.0000 | MEDICATED_PATCH | TRANSDERMAL | 1 refills | Status: DC

## 2021-07-09 NOTE — Telephone Encounter (Signed)
I do not mind sending it in.  Please just find out how many days she will be on the cruise. ?

## 2021-09-15 ENCOUNTER — Encounter: Payer: Self-pay | Admitting: Family Medicine

## 2021-09-15 ENCOUNTER — Ambulatory Visit (INDEPENDENT_AMBULATORY_CARE_PROVIDER_SITE_OTHER): Payer: 59 | Admitting: Family Medicine

## 2021-09-15 VITALS — BP 129/75 | HR 97 | Ht 65.0 in | Wt 320.0 lb

## 2021-09-15 DIAGNOSIS — I509 Heart failure, unspecified: Secondary | ICD-10-CM

## 2021-09-15 DIAGNOSIS — Z833 Family history of diabetes mellitus: Secondary | ICD-10-CM

## 2021-09-15 DIAGNOSIS — R7301 Impaired fasting glucose: Secondary | ICD-10-CM

## 2021-09-15 DIAGNOSIS — G5603 Carpal tunnel syndrome, bilateral upper limbs: Secondary | ICD-10-CM | POA: Diagnosis not present

## 2021-09-15 DIAGNOSIS — N760 Acute vaginitis: Secondary | ICD-10-CM

## 2021-09-15 DIAGNOSIS — Z6841 Body Mass Index (BMI) 40.0 and over, adult: Secondary | ICD-10-CM

## 2021-09-15 DIAGNOSIS — R202 Paresthesia of skin: Secondary | ICD-10-CM

## 2021-09-15 LAB — POCT URINALYSIS DIP (CLINITEK)
Bilirubin, UA: NEGATIVE
Blood, UA: NEGATIVE
Glucose, UA: NEGATIVE mg/dL
Leukocytes, UA: NEGATIVE
Nitrite, UA: NEGATIVE
Spec Grav, UA: 1.015 (ref 1.010–1.025)
Urobilinogen, UA: 4 E.U./dL — AB
pH, UA: 7 (ref 5.0–8.0)

## 2021-09-15 LAB — POCT UA - MICROALBUMIN
Albumin/Creatinine Ratio, Urine, POC: <30
Creatinine, POC: 200 mg/dL
Microalbumin Ur, POC: 30 mg/L

## 2021-09-15 LAB — POCT GLYCOSYLATED HEMOGLOBIN (HGB A1C): Hemoglobin A1C: 6 % — AB (ref 4.0–5.6)

## 2021-09-15 NOTE — Assessment & Plan Note (Signed)
A1c looks good today at 6.0.  Up just a little bit from previous that we had on file.  We discussed cutting back on sweets and carbs.  Plan to recheck again in 6 months.  Really do not think this is contributing to the numbness and tingling that she is experiencing but I definitely think we should continue to work on it.  Lab Results  Component Value Date   HGBA1C 6.0 (A) 09/15/2021

## 2021-09-15 NOTE — Progress Notes (Signed)
Acute Office Visit  Subjective:     Patient ID: [name], female    DOB: [birth year], 31 y.o.   MRN: [id number]  Chief Complaint  Patient presents with   increased thirst   skin distrubance    X several weeks   Dysuria        Vaginal Discharge    Clear no odor    HPI Patient is in today for tingling in her hands and feet.  She is noticed it mostly at night when she wakes up in the middle the night but occasionally when she is working on the computer during the day she will feel it for just a few seconds at a little bit more brief.  She went online and googled it and thought that it could be a sign of diabetes so wanted to get evaluated.    Also has noticed increased thirst.  Only drinks juice and water.  Feels ike something is crawling on her.    + vaginal d/c after a recent cruise.  She says it is mostly been clear she is not really had a lot of irritation or itching but she just says its not normal for her.  She is not currently pregnant.  She says she is seriously considering having weight loss surgery.  Her mom has had it and her boyfriend have both had the surgery.  She said she thought about it in the past but now she is seriously considering it.  He did want to discuss that as an option today.  ROS      Objective:    BP 129/75   Pulse 97   Ht 5\' 5"  (1.651 m)   Wt (!) 320 lb (145.2 kg)   SpO2 100%   BMI 53.25 kg/m    Physical Exam Vitals and nursing note reviewed.  Constitutional:      Appearance: She is well-developed.  HENT:     Head: Normocephalic and atraumatic.  Cardiovascular:     Rate and Rhythm: Normal rate and regular rhythm.     Heart sounds: Normal heart sounds.  Pulmonary:     Effort: Pulmonary effort is normal.     Breath sounds: Normal breath sounds.  Skin:    General: Skin is warm and dry.  Neurological:     Mental Status: She is alert and oriented to person, place, and time.     Comments: Negative Tinel's and Phalen sign.   Psychiatric:        Behavior: Behavior normal.    Results for orders placed or performed in visit on 09/15/21  POCT glycosylated hemoglobin (Hb A1C)  Result Value Ref Range   Hemoglobin A1C 6.0 (A) 4.0 - 5.6 %   HbA1c POC (<> result, manual entry)     HbA1c, POC (prediabetic range)     HbA1c, POC (controlled diabetic range)    POCT UA - Microalbumin  Result Value Ref Range   Microalbumin Ur, POC 30 mg/L   Creatinine, POC 200 mg/dL   Albumin/Creatinine Ratio, Urine, POC <30   POCT URINALYSIS DIP (CLINITEK)  Result Value Ref Range   Color, UA yellow yellow   Clarity, UA clear clear   Glucose, UA negative negative mg/dL   Bilirubin, UA negative negative   Ketones, POC UA trace (5) (A) negative mg/dL   Spec Grav, UA 09/17/21 3.888 - 1.025   Blood, UA negative negative   pH, UA 7.0 5.0 - 8.0   POC PROTEIN,UA trace negative, trace  Urobilinogen, UA 4.0 (A) 0.2 or 1.0 E.U./dL   Nitrite, UA Negative Negative   Leukocytes, UA Negative Negative        Assessment & Plan:   Problem List Items Addressed This Visit       Cardiovascular and Mediastinum   Heart failure (HCC)    Been really stable in regards to her heart failure and is currently on a good regimen she is now seeing cardiology every 6 months.         Endocrine   IFG (impaired fasting glucose)    A1c looks good today at 6.0.  Up just a little bit from previous that we had on file.  We discussed cutting back on sweets and carbs.  Plan to recheck again in 6 months.  Really do not think this is contributing to the numbness and tingling that she is experiencing but I definitely think we should continue to work on it.  Lab Results  Component Value Date   HGBA1C 6.0 (A) 09/15/2021         Relevant Orders   POCT UA - Microalbumin (Completed)   COMPLETE METABOLIC PANEL WITH GFR   TSH   B Nat Peptide   CBC   Lipid Panel w/reflex Direct LDL     Other   BMI 50.0-59.9, adult (HCC)    We discussed the typical  process for bariatric surgery and requirements.  I am more than happy to assist in any way that we can.  She is considering Dr. Jerald Kief is the surgeon.       Other Visit Diagnoses     Family history of diabetes mellitus (DM)    -  Primary   Relevant Orders   POCT glycosylated hemoglobin (Hb A1C) (Completed)   COMPLETE METABOLIC PANEL WITH GFR   TSH   B Nat Peptide   CBC   Lipid Panel w/reflex Direct LDL   Acute vaginitis       Relevant Orders   WET PREP FOR TRICH, YEAST, CLUE   POCT URINALYSIS DIP (CLINITEK) (Completed)   Bilateral carpal tunnel syndrome       Paresthesias       Relevant Orders   COMPLETE METABOLIC PANEL WITH GFR   TSH   B Nat Peptide   CBC   Lipid Panel w/reflex Direct LDL      Numbness and tingling in her hands I think it is really most consistent with carpal tunnel syndrome-we discussed wearing a cock-up splint at night to help keep the wrist in a neutral position overnight to see if this is helpful she types all day for living and is experiencing symptoms mostly at night but sometimes during the day as well.  If not improving with wearing the splint at night for the next 3 to 4 weeks then please let us know.  Vaginitis-wet prep performed.  Urinalysis was normal.  No orders of the defined types were placed in this encounter.   Return in about 6 months (around 03/18/2022) for Pre-diabetes.  Nani Gasser, MD

## 2021-09-15 NOTE — Assessment & Plan Note (Signed)
Been really stable in regards to her heart failure and is currently on a good regimen she is now seeing cardiology every 6 months.

## 2021-09-15 NOTE — Assessment & Plan Note (Signed)
We discussed the typical process for bariatric surgery and requirements.  I am more than happy to assist in any way that we can.  She is considering Dr. Jerald Kief is the surgeon.

## 2021-09-16 LAB — LIPID PANEL W/REFLEX DIRECT LDL
Cholesterol: 165 mg/dL (ref <200)
HDL: 50 mg/dL (ref >=50)
LDL Cholesterol (Calc): 96 mg/dL (calc)
Non-HDL Cholesterol (Calc): 115 mg/dL (calc) (ref <130)
Total CHOL/HDL Ratio: 3.3 (calc) (ref <5.0)
Triglycerides: 94 mg/dL (ref <150)

## 2021-09-16 LAB — COMPLETE METABOLIC PANEL WITH GFR
AG Ratio: 1.2 (calc) (ref 1.0–2.5)
ALT: 10 U/L (ref 6–29)
AST: 12 U/L (ref 10–30)
Albumin: 3.8 g/dL (ref 3.6–5.1)
Alkaline phosphatase (APISO): 110 U/L (ref 31–125)
BUN: 8 mg/dL (ref 7–25)
CO2: 31 mmol/L (ref 20–32)
Calcium: 9.4 mg/dL (ref 8.6–10.2)
Chloride: 103 mmol/L (ref 98–110)
Creat: 0.76 mg/dL (ref 0.50–0.97)
Globulin: 3.2 g/dL (calc) (ref 1.9–3.7)
Glucose, Bld: 131 mg/dL — ABNORMAL HIGH (ref 65–99)
Potassium: 4.3 mmol/L (ref 3.5–5.3)
Sodium: 142 mmol/L (ref 135–146)
Total Bilirubin: 0.8 mg/dL (ref 0.2–1.2)
Total Protein: 7 g/dL (ref 6.1–8.1)
eGFR: 108 mL/min/{1.73_m2} (ref >=60)

## 2021-09-16 LAB — BRAIN NATRIURETIC PEPTIDE: Brain Natriuretic Peptide: 23 pg/mL (ref <100)

## 2021-09-16 LAB — CBC
HCT: 39.3 % (ref 35.0–45.0)
Hemoglobin: 12.3 g/dL (ref 11.7–15.5)
MCH: 25.9 pg — ABNORMAL LOW (ref 27.0–33.0)
MCHC: 31.3 g/dL — ABNORMAL LOW (ref 32.0–36.0)
MCV: 82.7 fL (ref 80.0–100.0)
MPV: 10.3 fL (ref 7.5–12.5)
Platelets: 477 10*3/uL — ABNORMAL HIGH (ref 140–400)
RBC: 4.75 10*6/uL (ref 3.80–5.10)
RDW: 13.3 % (ref 11.0–15.0)
WBC: 12.4 10*3/uL — ABNORMAL HIGH (ref 3.8–10.8)

## 2021-09-16 LAB — WET PREP FOR TRICH, YEAST, CLUE
MICRO NUMBER:: 13432200
Specimen Quality: ADEQUATE

## 2021-09-16 LAB — TSH: TSH: 1.07 mIU/L

## 2021-09-16 MED ORDER — FLUCONAZOLE 150 MG PO TABS: 150.0000 mg | ORAL_TABLET | Freq: Once | ORAL | 1 refills | Status: AC

## 2021-09-16 NOTE — Addendum Note (Signed)
Addended by: Nani Gasser D on: 09/16/2021 05:11 PM   Modules accepted: Orders

## 2021-09-16 NOTE — Progress Notes (Signed)
Hi Shaivi, your metabolic panel overall looks good.  Glucose was up a little bit but again we know that you are prediabetic.  Your white blood cells are right around 12,000 which is a little on the high end above normal.  But nothing worrisome.  Hemoglobin looks good.  No anemia.  Your cholesterol and thyroid look great.  BNP still pending.

## 2021-10-16 ENCOUNTER — Ambulatory Visit: Payer: 59 | Admitting: Family Medicine

## 2021-10-21 DIAGNOSIS — I10 Essential (primary) hypertension: Secondary | ICD-10-CM | POA: Diagnosis not present

## 2021-10-21 DIAGNOSIS — R7303 Prediabetes: Secondary | ICD-10-CM | POA: Diagnosis not present

## 2021-10-27 ENCOUNTER — Ambulatory Visit (INDEPENDENT_AMBULATORY_CARE_PROVIDER_SITE_OTHER): Payer: BC Managed Care – PPO | Admitting: Family Medicine

## 2021-10-27 ENCOUNTER — Encounter: Payer: Self-pay | Admitting: Family Medicine

## 2021-10-27 VITALS — BP 116/70 | HR 94 | Ht 65.0 in | Wt 321.0 lb

## 2021-10-27 DIAGNOSIS — E669 Obesity, unspecified: Secondary | ICD-10-CM

## 2021-10-27 DIAGNOSIS — Z6841 Body Mass Index (BMI) 40.0 and over, adult: Secondary | ICD-10-CM | POA: Diagnosis not present

## 2021-10-27 NOTE — Progress Notes (Signed)
   Established Patient Office Visit  Subjective   Patient ID: [name], female    DOB: 02/16/1991  Age: 31 y.o. MRN: [id number]  Chief Complaint  Patient presents with   Pre-op Exam    HPI  31 yo female is here today for surgical clearance.  She needs a bariatric surgery support letter.  She has seriously started to try to lose weight right after college.  She has done weight watchers and going to the gym, and stationary bike.  She has taken medications such as phentermine and Qsymia, metformin in the past.  None of these have been consistently successful for her to lose weight and so she is currently being evaluated for Roux-en-Y Bariatric surgery.  She has a appointment with the surgeon in August.  She says she really has given it serious thought and understands that this will be life-changing.  Her current medical problems include chronic systolic heart failure, impaired fasting glucose, eczema, micromastia.  She has had difficulty with her left knee in the past but is not actively causing a problem.  BMI is 53.4 today.  He says when she has surgery her mom will be able to fly in from [place] and to help her for a couple of weeks.    ROS    Objective:     BP 116/70   Pulse 94   Ht 5\' 5"  (1.651 m)   Wt (!) 321 lb (145.6 kg)   SpO2 97%   BMI 53.42 kg/m    Physical Exam   No results found for any visits on 10/27/21.    The ASCVD Risk score (Arnett DK, et al., 2019) failed to calculate for the following reasons:   The 2019 ASCVD risk score is only valid for ages 45 to 38    Assessment & Plan:   Problem List Items Addressed This Visit       Other   BMI 50.0-59.9, adult (HCC) - Primary    She understands the life changes that will need to occur for her to have the surgery.  She has good support and her mom will come and help take care of her afterwards.  Letter completed supporting her in her efforts for bariatric surgery I think she would be a Fantastic  candidate.  Form completed and given to her.  Copy made to scanned into the chart.  Continue to work on healthy choices and increased activity level.       No follow-ups on file.   I spent 22 minutes on the day of the encounter to include pre-visit record review, face-to-face time with the patient and post visit ordering of test.   08-23-1994, MD

## 2021-10-27 NOTE — Assessment & Plan Note (Signed)
She understands the life changes that will need to occur for her to have the surgery.  She has good support and her mom will come and help take care of her afterwards.  Letter completed supporting her in her efforts for bariatric surgery I think she would be a Fantastic candidate.  Form completed and given to her.  Copy made to scanned into the chart.  Continue to work on healthy choices and increased activity level.

## 2021-11-04 DIAGNOSIS — Z713 Dietary counseling and surveillance: Secondary | ICD-10-CM | POA: Diagnosis not present

## 2021-11-05 DIAGNOSIS — I472 Ventricular tachycardia, unspecified: Secondary | ICD-10-CM | POA: Diagnosis not present

## 2021-11-05 DIAGNOSIS — I5022 Chronic systolic (congestive) heart failure: Secondary | ICD-10-CM | POA: Diagnosis not present

## 2021-11-05 DIAGNOSIS — Z6841 Body Mass Index (BMI) 40.0 and over, adult: Secondary | ICD-10-CM | POA: Diagnosis not present

## 2021-11-05 DIAGNOSIS — Z79899 Other long term (current) drug therapy: Secondary | ICD-10-CM | POA: Diagnosis not present

## 2021-11-05 DIAGNOSIS — R7303 Prediabetes: Secondary | ICD-10-CM | POA: Diagnosis not present

## 2021-11-11 DIAGNOSIS — I517 Cardiomegaly: Secondary | ICD-10-CM | POA: Diagnosis not present

## 2021-11-11 DIAGNOSIS — I340 Nonrheumatic mitral (valve) insufficiency: Secondary | ICD-10-CM | POA: Diagnosis not present

## 2021-11-11 DIAGNOSIS — I5022 Chronic systolic (congestive) heart failure: Secondary | ICD-10-CM | POA: Diagnosis not present

## 2021-11-14 ENCOUNTER — Other Ambulatory Visit: Payer: Self-pay | Admitting: Family Medicine

## 2021-11-14 DIAGNOSIS — H60502 Unspecified acute noninfective otitis externa, left ear: Secondary | ICD-10-CM

## 2021-11-24 DIAGNOSIS — Z713 Dietary counseling and surveillance: Secondary | ICD-10-CM | POA: Diagnosis not present

## 2021-11-25 DIAGNOSIS — Z6841 Body Mass Index (BMI) 40.0 and over, adult: Secondary | ICD-10-CM | POA: Diagnosis not present

## 2021-11-25 DIAGNOSIS — F54 Psychological and behavioral factors associated with disorders or diseases classified elsewhere: Secondary | ICD-10-CM | POA: Diagnosis not present

## 2021-11-25 DIAGNOSIS — Z7189 Other specified counseling: Secondary | ICD-10-CM | POA: Diagnosis not present

## 2021-12-01 ENCOUNTER — Encounter: Payer: Self-pay | Admitting: Family Medicine

## 2021-12-01 ENCOUNTER — Ambulatory Visit (INDEPENDENT_AMBULATORY_CARE_PROVIDER_SITE_OTHER): Payer: BC Managed Care – PPO | Admitting: Family Medicine

## 2021-12-01 VITALS — BP 132/85 | HR 70 | Ht 65.0 in | Wt 322.0 lb

## 2021-12-01 DIAGNOSIS — Z01818 Encounter for other preprocedural examination: Secondary | ICD-10-CM | POA: Diagnosis not present

## 2021-12-01 NOTE — Progress Notes (Signed)
EKG

## 2021-12-01 NOTE — Progress Notes (Signed)
   Established Patient Office Visit  Subjective   Patient ID: [name], female    DOB: [birth year]  Age: 31 y.o. MRN: [id number]  Chief Complaint  Patient presents with   Follow-up    HPI  Here today for EKG for preoperative clearance.    ROS    Objective:     BP 132/85   Pulse 70   Ht 5\' 5"  (1.651 m)   Wt (!) 322 lb (146.1 kg)   SpO2 99%   BMI 53.58 kg/m    Physical Exam   No results found for any visits on 12/01/21.    The ASCVD Risk score (Arnett DK, et al., 2019) failed to calculate for the following reasons:   The 2019 ASCVD risk score is only valid for ages 10 to 22    Assessment & Plan:   Problem List Items Addressed This Visit   None Visit Diagnoses     Preop examination    -  Primary   Relevant Orders   EKG 12-Lead      EKG today shows rate of 87 bpm, normal sinus rhythm.  She has T wave inversion in 3 and aVF which were present back in 2021 she also has T wave inversion in the anterior lateral leads which have been present since at least February 2022.  So EKG looks stable.  We will fax EKG over to their office  No follow-ups on file.    March 2022, MD

## 2021-12-04 DIAGNOSIS — Z01818 Encounter for other preprocedural examination: Secondary | ICD-10-CM | POA: Diagnosis not present

## 2021-12-04 DIAGNOSIS — I10 Essential (primary) hypertension: Secondary | ICD-10-CM | POA: Diagnosis not present

## 2021-12-04 DIAGNOSIS — R7303 Prediabetes: Secondary | ICD-10-CM | POA: Diagnosis not present

## 2021-12-11 ENCOUNTER — Ambulatory Visit: Payer: BC Managed Care – PPO | Admitting: Family Medicine

## 2021-12-19 DIAGNOSIS — R7303 Prediabetes: Secondary | ICD-10-CM | POA: Diagnosis not present

## 2021-12-19 DIAGNOSIS — I10 Essential (primary) hypertension: Secondary | ICD-10-CM | POA: Diagnosis not present

## 2021-12-21 ENCOUNTER — Encounter: Payer: Self-pay | Admitting: Family Medicine

## 2021-12-22 ENCOUNTER — Ambulatory Visit (INDEPENDENT_AMBULATORY_CARE_PROVIDER_SITE_OTHER): Payer: BC Managed Care – PPO | Admitting: Family Medicine

## 2021-12-22 VITALS — BP 119/84 | HR 101 | Ht 65.0 in | Wt 322.0 lb

## 2021-12-22 DIAGNOSIS — K644 Residual hemorrhoidal skin tags: Secondary | ICD-10-CM | POA: Diagnosis not present

## 2021-12-22 MED ORDER — HYDROCORTISONE 2.5 % EX CREA
TOPICAL_CREAM | Freq: Two times a day (BID) | CUTANEOUS | 1 refills | Status: DC
Start: 2021-12-22 — End: 2022-03-16

## 2021-12-22 NOTE — Progress Notes (Signed)
   Acute Office Visit  Subjective:     Patient ID: [name], female    DOB: [birth year], 31 y.o.   MRN: [id number]  Chief Complaint  Patient presents with   Hemorrhoids    HPI Patient is in today for rectal irritation and pain for at least a couple of weeks.  She thinks she may have a hemorrhoid.  No blood.  BMs are not hard. Started liquid diet for her weight loss surgery.   ROS      Objective:    BP 119/84   Pulse (!) 101   Ht 5\' 5"  (1.651 m)   Wt (!) 322 lb (146.1 kg)   SpO2 99%   BMI 53.58 kg/m    Physical Exam  No results found for any visits on 12/22/21.      Assessment & Plan:   Problem List Items Addressed This Visit   None Visit Diagnoses     External hemorrhoid    -  Primary      On rectal exam she has a external hemorrhoid at the 12 o'clock position and a small tear at the 6 o'clock position.  We discussed using gentle warm water rinses after bowel movement and patting dry instead of wiping.  Could do a trial with sits baths if she has time to do so.  Recommend treatment with topical hydrocortisone cream for comfort and swelling.  Can keep bowels movements soft.  If not improving over the next couple weeks please let 12/24/21 know.  Can also add over-the-counter topical lidocaine if needed.   Meds ordered this encounter  Medications   hydrocortisone 2.5 % cream    Sig: Apply topically 2 (two) times daily.    Dispense:  30 g    Refill:  1    No follow-ups on file.  [place], MD

## 2022-01-02 DIAGNOSIS — N898 Other specified noninflammatory disorders of vagina: Secondary | ICD-10-CM | POA: Diagnosis not present

## 2022-01-02 DIAGNOSIS — R1030 Lower abdominal pain, unspecified: Secondary | ICD-10-CM | POA: Diagnosis not present

## 2022-01-07 DIAGNOSIS — I10 Essential (primary) hypertension: Secondary | ICD-10-CM | POA: Diagnosis not present

## 2022-01-07 DIAGNOSIS — I509 Heart failure, unspecified: Secondary | ICD-10-CM | POA: Diagnosis not present

## 2022-01-07 DIAGNOSIS — R7303 Prediabetes: Secondary | ICD-10-CM | POA: Diagnosis not present

## 2022-01-12 IMAGING — DX DG KNEE COMPLETE 4+V*L*
4 series · 4 of 4 positions shown · non-contrast
Comparison: None.

CLINICAL DATA: Left knee pain.

EXAM:
LEFT KNEE - COMPLETE 4+ VIEW

[knee ap]
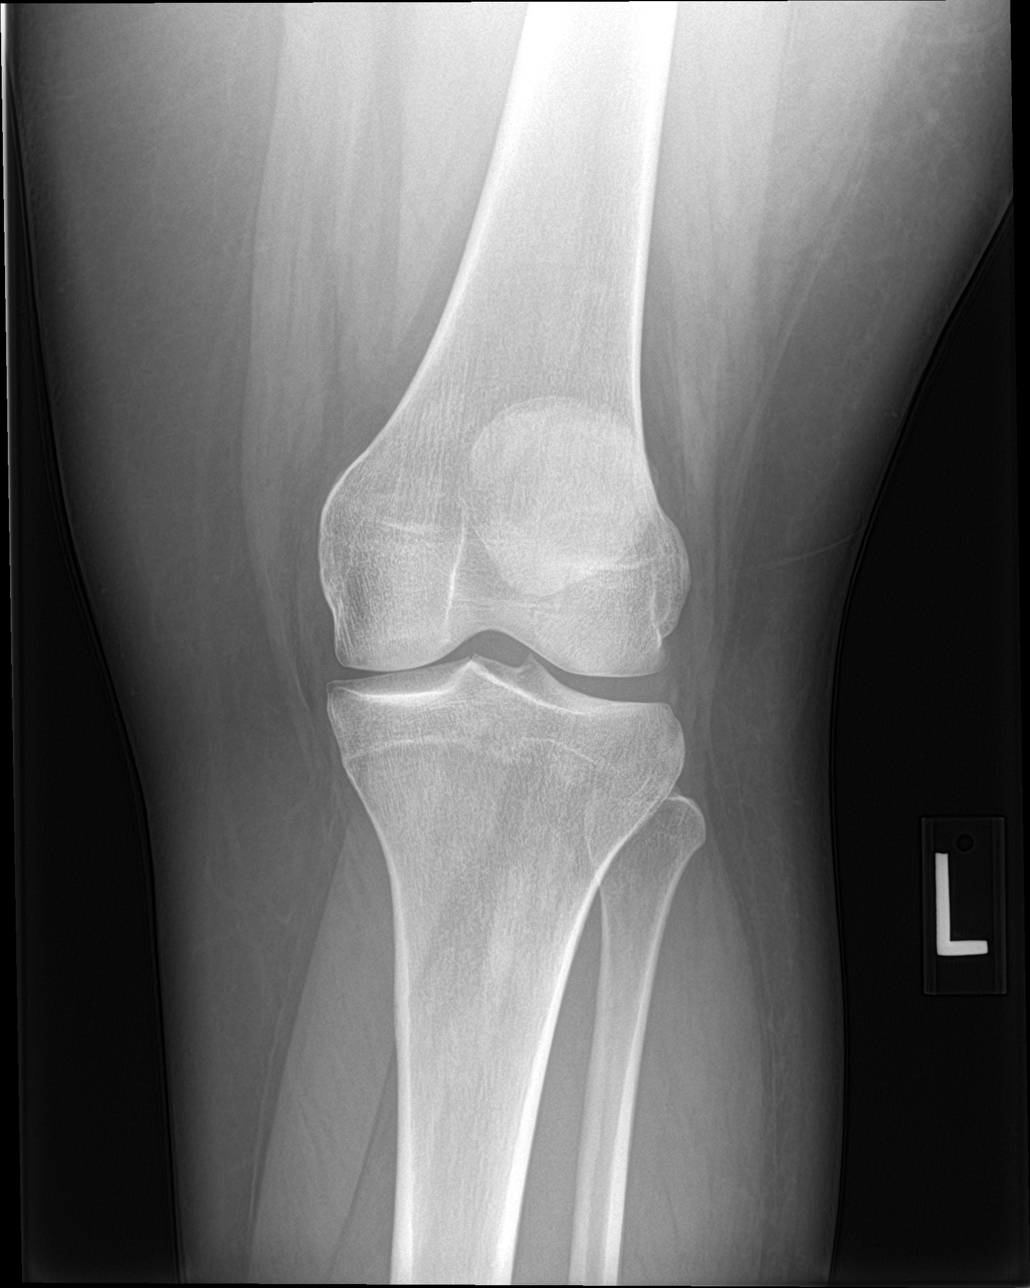

[knee lat]
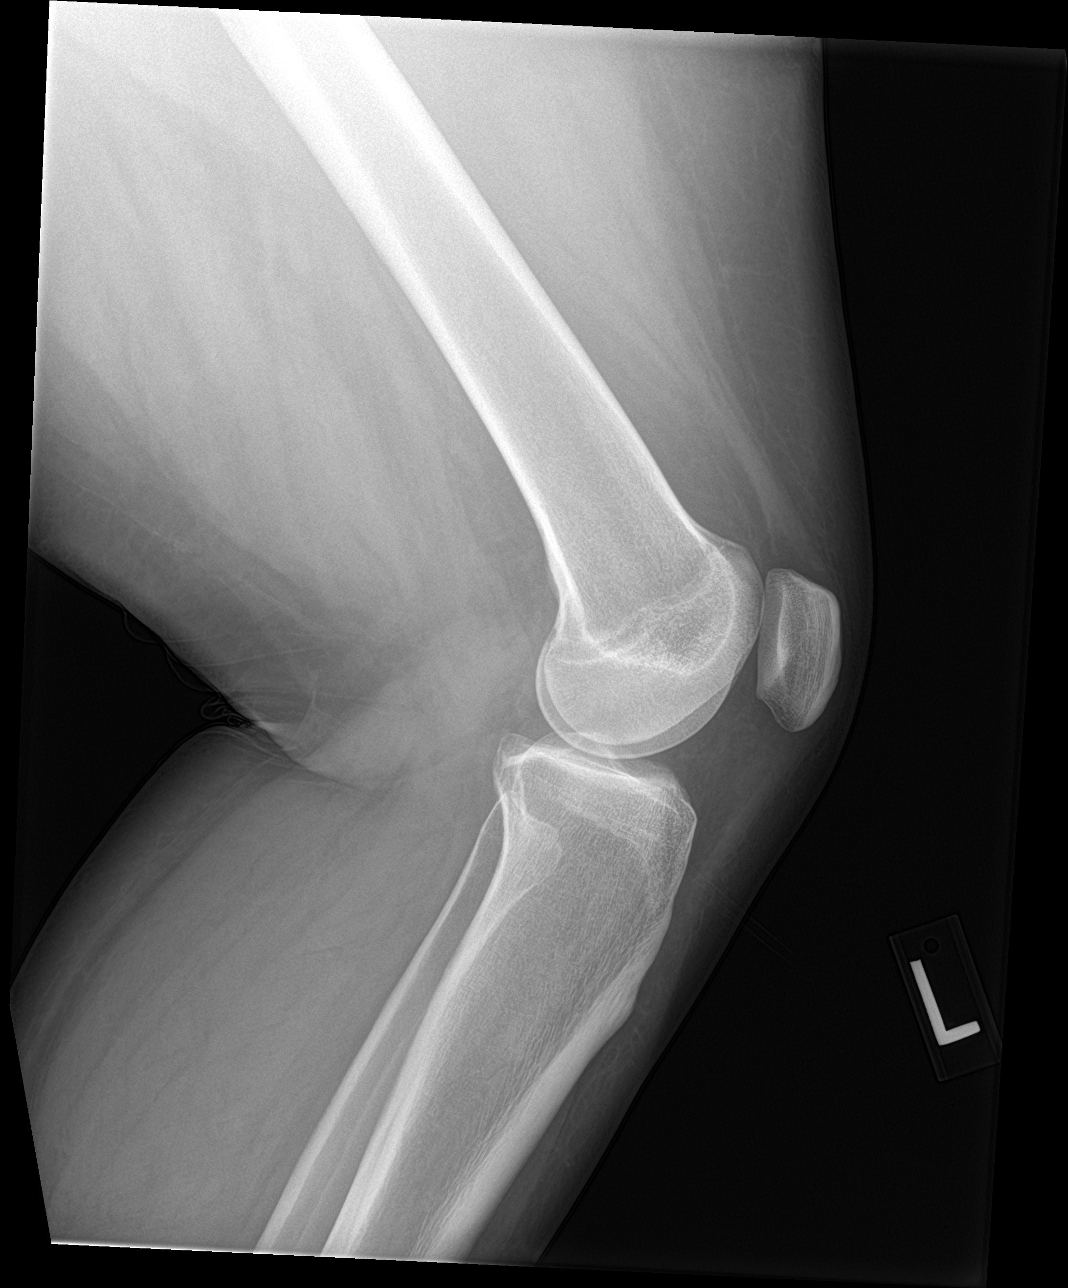

[knee obl (1 of 2)]
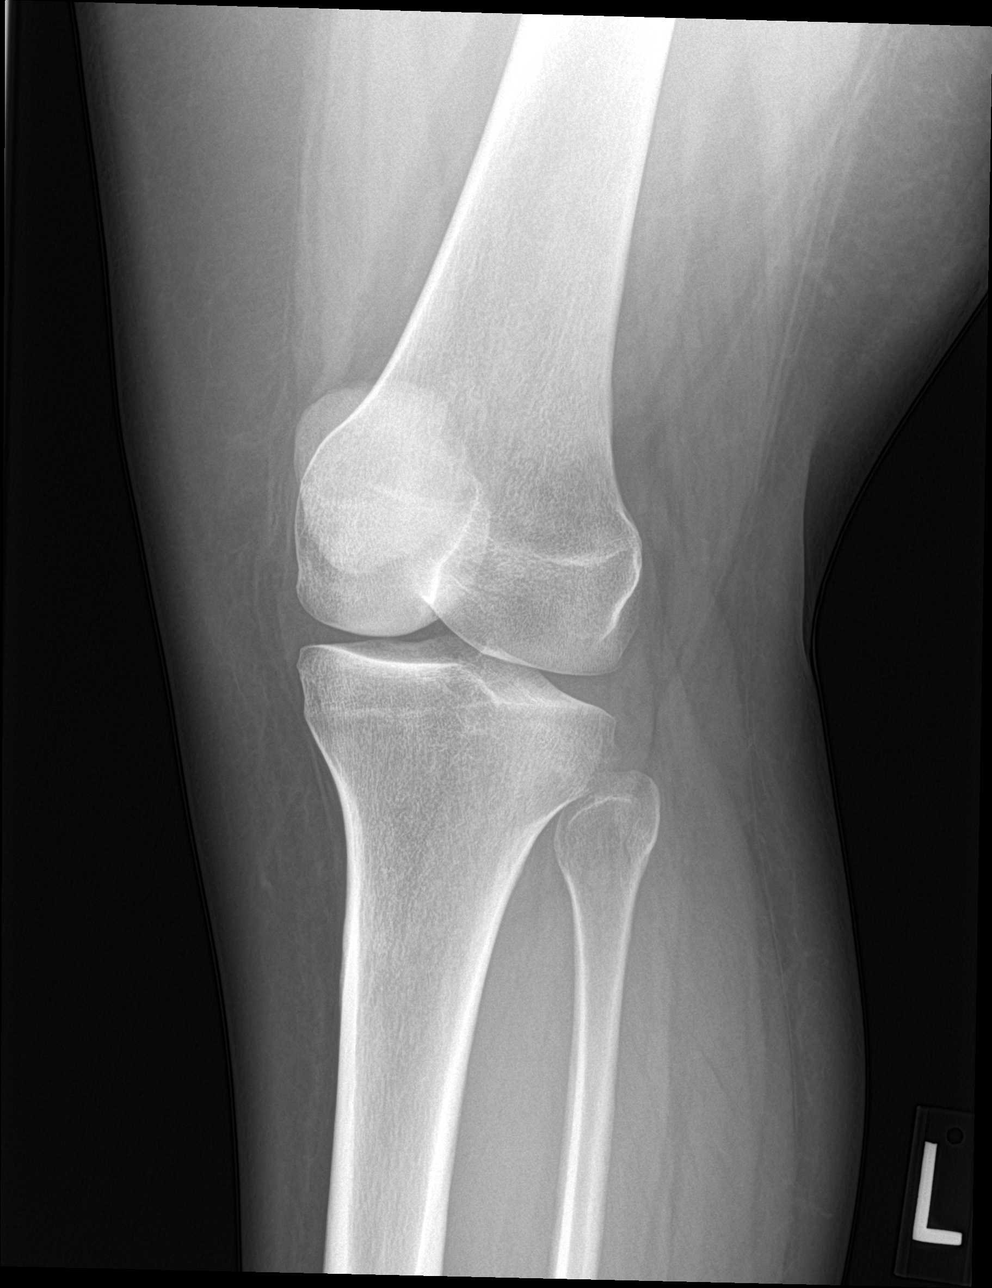

[knee obl (2 of 2)]
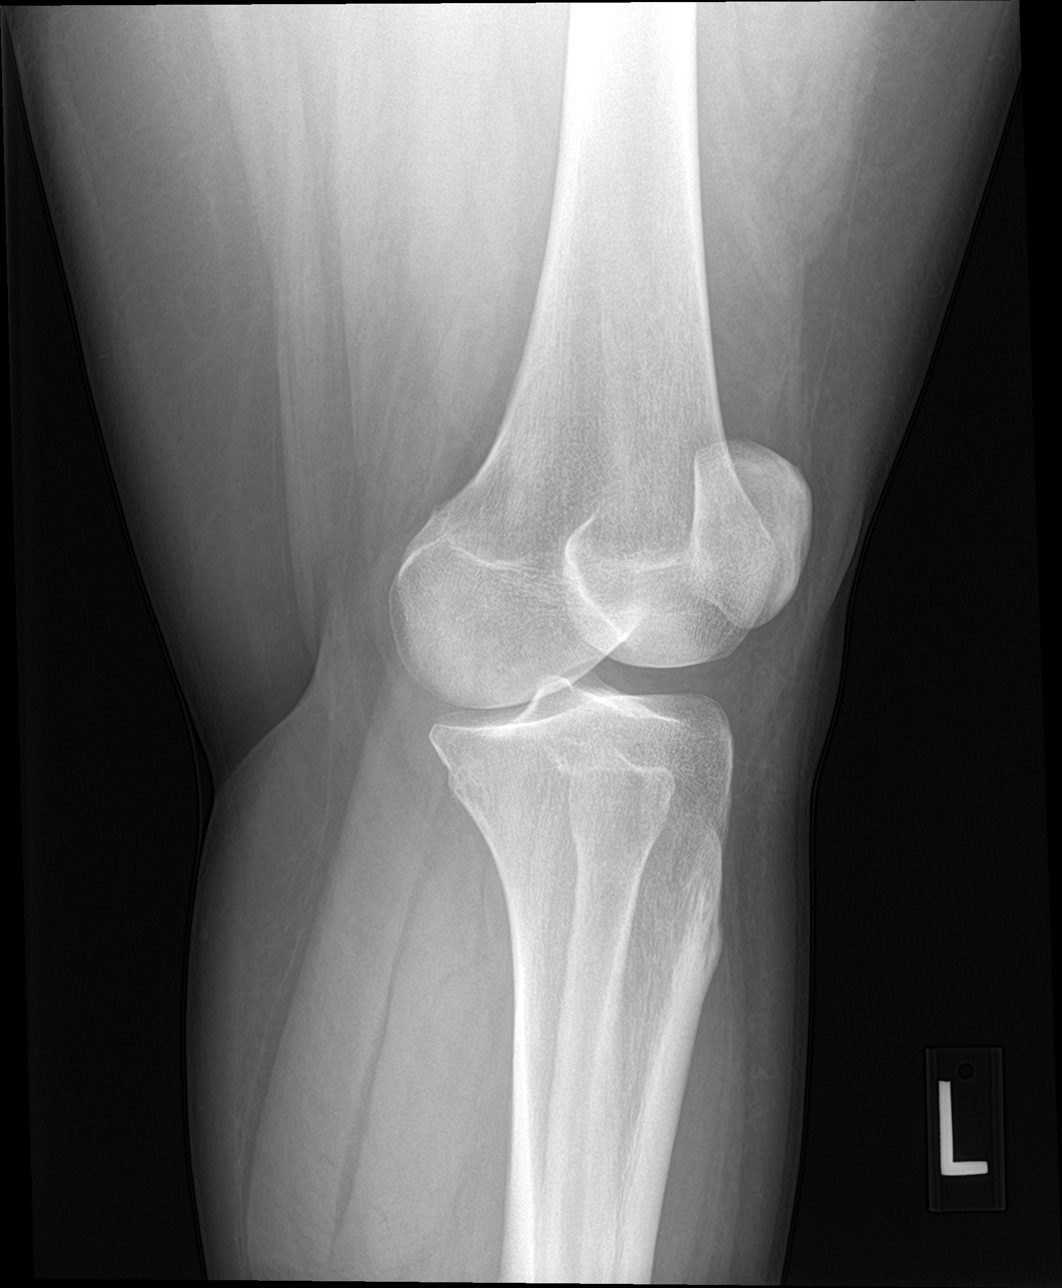

[4 of 4 positions shown; findings below may reference images not displayed]

FINDINGS: No evidence of fracture, dislocation, or joint effusion. No evidence
of arthropathy or other focal bone abnormality. Soft tissues are
unremarkable.
IMPRESSION: Negative.

## 2022-01-19 ENCOUNTER — Telehealth: Payer: Self-pay | Admitting: General Practice

## 2022-01-19 DIAGNOSIS — M62830 Muscle spasm of back: Secondary | ICD-10-CM | POA: Diagnosis not present

## 2022-01-19 DIAGNOSIS — R7989 Other specified abnormal findings of blood chemistry: Secondary | ICD-10-CM | POA: Diagnosis not present

## 2022-01-19 DIAGNOSIS — R918 Other nonspecific abnormal finding of lung field: Secondary | ICD-10-CM | POA: Diagnosis not present

## 2022-01-19 DIAGNOSIS — I509 Heart failure, unspecified: Secondary | ICD-10-CM | POA: Diagnosis not present

## 2022-01-19 DIAGNOSIS — I517 Cardiomegaly: Secondary | ICD-10-CM | POA: Diagnosis not present

## 2022-01-19 DIAGNOSIS — Z79899 Other long term (current) drug therapy: Secondary | ICD-10-CM | POA: Diagnosis not present

## 2022-01-19 DIAGNOSIS — R079 Chest pain, unspecified: Secondary | ICD-10-CM | POA: Diagnosis not present

## 2022-01-19 DIAGNOSIS — M549 Dorsalgia, unspecified: Secondary | ICD-10-CM | POA: Diagnosis not present

## 2022-01-19 DIAGNOSIS — R000 Tachycardia, unspecified: Secondary | ICD-10-CM | POA: Diagnosis not present

## 2022-01-19 NOTE — Telephone Encounter (Signed)
Transition Care Management Follow-up Telephone Call Date of discharge and from where: 01/19/22 from [facility] How have you been since you were released from the hospital? Having lower back pain. Feels that it has eased up but sharp pain is still there. She has not started the Robaxin yet. She wants to try to take the medication for one-two days and if she does not have any relief she will call to make an appt with PCP. Any questions or concerns? No  Items Reviewed: Did the pt receive and understand the discharge instructions provided? Yes  Medications obtained and verified? No  Other? No  Any new allergies since your discharge? No  Dietary orders reviewed? Yes Do you have support at home? Yes   Home Care and Equipment/Supplies: Were home health services ordered? no  Functional Questionnaire: (I = Independent and D = Dependent) ADLs: I  Bathing/Dressing- I  Meal Prep- I  Eating- I  Maintaining continence- I  Transferring/Ambulation- I  Managing Meds- I  Follow up appointments reviewed:  PCP Hospital f/u appt confirmed? No   Specialist Hospital f/u appt confirmed? No   Are transportation arrangements needed? No  If their condition worsens, is the pt aware to call PCP or go to the Emergency Dept.? Yes Was the patient provided with contact information for the PCP's office or ED? Yes Was to pt encouraged to call back with questions or concerns? Yes

## 2022-01-20 DIAGNOSIS — R7303 Prediabetes: Secondary | ICD-10-CM | POA: Diagnosis not present

## 2022-01-20 DIAGNOSIS — I10 Essential (primary) hypertension: Secondary | ICD-10-CM | POA: Diagnosis not present

## 2022-01-20 DIAGNOSIS — Z6841 Body Mass Index (BMI) 40.0 and over, adult: Secondary | ICD-10-CM | POA: Diagnosis not present

## 2022-01-21 ENCOUNTER — Encounter: Payer: Self-pay | Admitting: Family Medicine

## 2022-01-21 DIAGNOSIS — K644 Residual hemorrhoidal skin tags: Secondary | ICD-10-CM

## 2022-01-21 MED ORDER — HYDROCORT-PRAMOXINE (PERIANAL) 1-1 % EX FOAM: 1.0000 | Freq: Two times a day (BID) | CUTANEOUS | 99 refills | Status: AC

## 2022-01-26 DIAGNOSIS — M549 Dorsalgia, unspecified: Secondary | ICD-10-CM | POA: Diagnosis not present

## 2022-01-26 DIAGNOSIS — K219 Gastro-esophageal reflux disease without esophagitis: Secondary | ICD-10-CM | POA: Diagnosis not present

## 2022-01-26 DIAGNOSIS — Z91013 Allergy to seafood: Secondary | ICD-10-CM | POA: Diagnosis not present

## 2022-01-26 DIAGNOSIS — Z79899 Other long term (current) drug therapy: Secondary | ICD-10-CM | POA: Diagnosis not present

## 2022-01-26 DIAGNOSIS — G8918 Other acute postprocedural pain: Secondary | ICD-10-CM | POA: Diagnosis not present

## 2022-01-26 DIAGNOSIS — R7303 Prediabetes: Secondary | ICD-10-CM | POA: Diagnosis not present

## 2022-01-26 DIAGNOSIS — I110 Hypertensive heart disease with heart failure: Secondary | ICD-10-CM | POA: Diagnosis not present

## 2022-01-26 DIAGNOSIS — I5022 Chronic systolic (congestive) heart failure: Secondary | ICD-10-CM | POA: Diagnosis not present

## 2022-01-26 DIAGNOSIS — I340 Nonrheumatic mitral (valve) insufficiency: Secondary | ICD-10-CM | POA: Diagnosis not present

## 2022-01-26 DIAGNOSIS — Z6841 Body Mass Index (BMI) 40.0 and over, adult: Secondary | ICD-10-CM | POA: Diagnosis not present

## 2022-02-12 DIAGNOSIS — R002 Palpitations: Secondary | ICD-10-CM | POA: Diagnosis not present

## 2022-02-12 DIAGNOSIS — I5022 Chronic systolic (congestive) heart failure: Secondary | ICD-10-CM | POA: Diagnosis not present

## 2022-02-12 DIAGNOSIS — Z79899 Other long term (current) drug therapy: Secondary | ICD-10-CM | POA: Diagnosis not present

## 2022-02-12 DIAGNOSIS — R7303 Prediabetes: Secondary | ICD-10-CM | POA: Diagnosis not present

## 2022-03-16 ENCOUNTER — Encounter: Payer: Self-pay | Admitting: Family Medicine

## 2022-03-16 ENCOUNTER — Ambulatory Visit (INDEPENDENT_AMBULATORY_CARE_PROVIDER_SITE_OTHER): Payer: BC Managed Care – PPO | Admitting: Family Medicine

## 2022-03-16 VITALS — BP 123/70 | HR 94 | Ht 65.0 in | Wt 292.0 lb

## 2022-03-16 DIAGNOSIS — R21 Rash and other nonspecific skin eruption: Secondary | ICD-10-CM | POA: Diagnosis not present

## 2022-03-16 DIAGNOSIS — Z9884 Bariatric surgery status: Secondary | ICD-10-CM

## 2022-03-16 DIAGNOSIS — R7301 Impaired fasting glucose: Secondary | ICD-10-CM | POA: Diagnosis not present

## 2022-03-16 LAB — POCT GLYCOSYLATED HEMOGLOBIN (HGB A1C): Hemoglobin A1C: 5.6 % (ref 4.0–5.6)

## 2022-03-16 MED ORDER — TRIAMCINOLONE ACETONIDE 0.5 % EX OINT: 1.0000 | TOPICAL_OINTMENT | Freq: Every day | CUTANEOUS | 0 refills | Status: AC | PRN

## 2022-03-16 NOTE — Assessment & Plan Note (Signed)
He is doing really well still exercising twice a week and trying to get used to what she is able to eat and not eat.  She is doing well with staying hydrated.

## 2022-03-16 NOTE — Progress Notes (Signed)
Established Patient Office Visit  Subjective   Patient ID: [name], female    DOB: 06/27/1990  Age: 31 y.o. MRN: [id number]  No chief complaint on file.   HPI Impaired fasting glucose-no increased thirst or urination. No symptoms consistent with hypoglycemia.  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  She is doing well she is lost about 32 pounds total in the last 6 to 7 weeks.  She says she feels better physically, she is been resting better.  She still going to the gym 2 days/week and actually looks forward to working out.  She still just try to figure out what she can eat and what she can eat.  Still try to get that protein in.  She feels like she is doing really well staying hydrated.  Follow-up heart failure-they have discontinued her Lasix she is only had to use it once in the last couple of weeks.  And they are hoping in 6 months when I see her back that she might even be able to come off of her Entresto.  But for now she is on the Entresto and spironolactone.  She also has a rash on her right side that she would like me to look at today.  This to the time its not itchy or bothersome.  She has tried several good moisturizer such as Cetaphil and Aquaphor over-the-counter but it does not seem to be helping.    ROS    Objective:     BP 123/70   Pulse 94   Ht 5\' 5"  (1.651 m)   Wt 292 lb (132.5 kg)   SpO2 98%   BMI 48.59 kg/m    Physical Exam Vitals and nursing note reviewed.  Constitutional:      Appearance: She is well-developed.  HENT:     Head: Normocephalic and atraumatic.  Cardiovascular:     Rate and Rhythm: Normal rate and regular rhythm.     Heart sounds: Normal heart sounds.  Pulmonary:     Effort: Pulmonary effort is normal.     Breath sounds: Normal breath sounds.  Skin:    General: Skin is warm and dry.     Comments: Her right side she has some hyperpigmentation of the skin that is in a macular distribution from her right side going towards her  mid back area.  She does have a history of eczema as well but no thickened scale.  Neurological:     Mental Status: She is alert and oriented to person, place, and time.  Psychiatric:        Behavior: Behavior normal.     Results for orders placed or performed in visit on 03/16/22  POCT glycosylated hemoglobin (Hb A1C)  Result Value Ref Range   Hemoglobin A1C 5.6 4.0 - 5.6 %   HbA1c POC (<> result, manual entry)     HbA1c, POC (prediabetic range)     HbA1c, POC (controlled diabetic range)        The ASCVD Risk score (Arnett DK, et al., 2019) failed to calculate for the following reasons:   The 2019 ASCVD risk score is only valid for ages 66 to 54    Assessment & Plan:   Problem List Items Addressed This Visit       Endocrine   IFG (impaired fasting glucose) - Primary    A1C looks phenomenal today and in fact is back in the normal range.  Plan will be to recheck it again in 1 year  just to have 2 consistent A1c is under 5.7.      Relevant Orders   POCT glycosylated hemoglobin (Hb A1C) (Completed)     Other   S/P gastric bypass    He is doing really well still exercising twice a week and trying to get used to what she is able to eat and not eat.  She is doing well with staying hydrated.      Other Visit Diagnoses     Rash       Relevant Medications   triamcinolone ointment (KENALOG) 0.5 %       Rash -It looks like it is more from chronic irritation and inflammation-we will treat with topical steroid ointment.  Okay to place moisturizer on top to still on medication hopefully it will start improving over the next couple weeks but if not please let me know.  No follow-ups on file.    Beatrice Lecher, MD

## 2022-03-16 NOTE — Assessment & Plan Note (Signed)
A1C looks phenomenal today and in fact is back in the normal range.  Plan will be to recheck it again in 1 year just to have 2 consistent A1c is under 5.7.

## 2022-05-25 ENCOUNTER — Telehealth: Payer: Self-pay | Admitting: General Practice

## 2022-05-25 NOTE — Telephone Encounter (Signed)
Transition Care Management Unsuccessful Follow-up Telephone Call  Date of discharge and from where:  05/24/22 from [facility]  Attempts:  1st Attempt  Reason for unsuccessful TCM follow-up call:  Left voice message    

## 2022-05-28 NOTE — Telephone Encounter (Signed)
Transition Care Management Unsuccessful Follow-up Telephone Call  Date of discharge and from where:  05/24/22 from [facility]  Attempts:  2nd Attempt  Reason for unsuccessful TCM follow-up call:  Left voice message    

## 2022-05-29 NOTE — Telephone Encounter (Signed)
Transition Care Management Unsuccessful Follow-up Telephone Call  Date of discharge and from where:  05/24/22 from [facility]  Attempts:  3rd Attempt  Reason for unsuccessful TCM follow-up call:  Left voice message
